# Patient Record
Sex: Female | Born: 2000 | Race: White | Hispanic: No | Marital: Single | Smoking: Never smoker
Health system: Southern US, Community
[De-identification: ages and names within clinical notes are randomized; demographics above are authoritative.]

## PROBLEM LIST (undated history)

## (undated) DIAGNOSIS — I10 Essential (primary) hypertension: Secondary | ICD-10-CM

## (undated) DIAGNOSIS — T7840XA Allergy, unspecified, initial encounter: Secondary | ICD-10-CM

## (undated) HISTORY — DX: Allergy, unspecified, initial encounter: T78.40XA

## (undated) HISTORY — PX: TONSILLECTOMY: SUR1361

## (undated) HISTORY — DX: Essential (primary) hypertension: I10

## (undated) HISTORY — PX: FRACTURE SURGERY: SHX138

---
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
## 2021-12-21 ENCOUNTER — Emergency Department
Admission: EM | Admit: 2021-12-21 | Discharge: 2021-12-21 | Disposition: A | Discharge status: Home / Self Care | Payer: BC Managed Care – PPO | Attending: Emergency Medicine | Admitting: Emergency Medicine

## 2021-12-21 ENCOUNTER — Emergency Department: Payer: BC Managed Care – PPO

## 2021-12-21 ENCOUNTER — Other Ambulatory Visit: Payer: Self-pay

## 2021-12-21 ENCOUNTER — Encounter: Payer: Self-pay | Admitting: Emergency Medicine

## 2021-12-21 DIAGNOSIS — R030 Elevated blood-pressure reading, without diagnosis of hypertension: Secondary | ICD-10-CM

## 2021-12-21 DIAGNOSIS — R519 Headache, unspecified: Secondary | ICD-10-CM | POA: Insufficient documentation

## 2021-12-21 LAB — CBC WITH DIFFERENTIAL/PLATELET
Abs Immature Granulocytes: 0.04 10*3/uL (ref 0.00–0.07)
Basophils Absolute: 0.1 10*3/uL (ref 0.0–0.1)
Basophils Relative: 1 %
Eosinophils Absolute: 0.3 10*3/uL (ref 0.0–0.5)
Eosinophils Relative: 2 %
HCT: 44.3 % (ref 36.0–46.0)
Hemoglobin: 14.7 g/dL (ref 12.0–15.0)
Immature Granulocytes: 0 %
Lymphocytes Relative: 26 %
Lymphs Abs: 3.1 10*3/uL (ref 0.7–4.0)
MCH: 28.4 pg (ref 26.0–34.0)
MCHC: 33.2 g/dL (ref 30.0–36.0)
MCV: 85.5 fL (ref 80.0–100.0)
Monocytes Absolute: 1.1 10*3/uL — ABNORMAL HIGH (ref 0.1–1.0)
Monocytes Relative: 9 %
Neutro Abs: 7.2 10*3/uL (ref 1.7–7.7)
Neutrophils Relative %: 62 %
Platelets: 247 10*3/uL (ref 150–400)
RBC: 5.18 MIL/uL — ABNORMAL HIGH (ref 3.87–5.11)
RDW: 12.5 % (ref 11.5–15.5)
WBC: 11.8 10*3/uL — ABNORMAL HIGH (ref 4.0–10.5)
nRBC: 0 % (ref 0.0–0.2)

## 2021-12-21 LAB — BASIC METABOLIC PANEL
Anion gap: 9 (ref 5–15)
BUN: 13 mg/dL (ref 6–20)
CO2: 26 mmol/L (ref 22–32)
Calcium: 9.6 mg/dL (ref 8.9–10.3)
Chloride: 102 mmol/L (ref 98–111)
Creatinine, Ser: 0.81 mg/dL (ref 0.44–1.00)
GFR, Estimated: >60 mL/min (ref >60)
Glucose, Bld: 96 mg/dL (ref 70–99)
Potassium: 3.9 mmol/L (ref 3.5–5.1)
Sodium: 137 mmol/L (ref 135–145)

## 2021-12-21 MED ORDER — SODIUM CHLORIDE 0.9 % IV BOLUS
1000.0000 mL | Freq: Once | INTRAVENOUS | Status: AC
  Administered 2021-12-21: 1000 mL via INTRAVENOUS

## 2021-12-21 MED ORDER — KETOROLAC TROMETHAMINE 30 MG/ML IJ SOLN
30.0000 mg | Freq: Once | INTRAMUSCULAR | Status: AC
  Administered 2021-12-21: 30 mg via INTRAVENOUS
  Filled 2021-12-21: qty 1

## 2021-12-21 MED ORDER — METOCLOPRAMIDE HCL 5 MG/ML IJ SOLN
10.0000 mg | Freq: Once | INTRAMUSCULAR | Status: AC
Start: 2021-12-21 — End: 2021-12-21
  Administered 2021-12-21: 10 mg via INTRAVENOUS
  Filled 2021-12-21: qty 2

## 2021-12-21 MED ORDER — HYDROCHLOROTHIAZIDE 25 MG PO TABS: 25.0000 mg | ORAL_TABLET | Freq: Every day | ORAL | 2 refills | Status: DC

## 2021-12-21 NOTE — Discharge Instructions (Signed)
Follow-up with [facility].  Please call them for appointment.  Tell them you are seen in the ER we recommended you see a GYN doctor to discuss other birth control options.  Monitor your blood pressure daily.  Cut out salt and sodium rich foods. If your blood pressure remains elevated you may start on the HCTZ daily. Do try drinking more water, use 1 teaspoon of cinnamon daily.  Return to emergency department if you are worsening.

## 2021-12-21 NOTE — ED Provider Triage Note (Signed)
Emergency Medicine Provider Triage Evaluation Note  Colleen Le , a 21 y.o. female  was evaluated in triage.  Pt complains of headache, elevated blood pressure.  Patient is on birth control pills x7 months.  Review of Systems  Positive: Headache, elevated blood pressure Negative: Chest pain shortness of breath  Physical Exam  BP (!) 153/104 (BP Location: Left Arm)    Pulse (!) 123    Temp 98.3 F (36.8 C) (Oral)    Resp 18    Ht 5\' 6"  (1.676 m)    Wt 89.8 kg    SpO2 100%    BMI 31.96 kg/m  Gen:   Awake, no distress   Resp:  Normal effort  MSK:   Moves extremities without difficulty  Other:    Medical Decision Making  Medically screening exam initiated at 2:34 PM.  Appropriate orders placed.  was informed that the remainder of the evaluation will be completed by another provider, this initial triage assessment does not replace that evaluation, and the importance of remaining in the ED until their evaluation is complete.  Mother states the child's had some numbness, will do CT due to elevated blood pressure.  Explained to the patient that possibility that her birth control pills are elevating her blood pressure.   [name], PA-C 12/21/21 1435

## 2021-12-21 NOTE — ED Notes (Signed)
Pt to ED with mother, had severe HA and went to walk in clinic and they told her BP was high (SBP 150). On waiting lists for several PCP offices.

## 2021-12-21 NOTE — ED Triage Notes (Signed)
Pt here with chronic headaches and hypertension. Pt states her bp was 148/100 earlier. Pt states that the headaches began 3 weeks ago. Pt had nausea this morning but denies V/D. Pt in NAD in triage.

## 2021-12-21 NOTE — ED Provider Notes (Signed)
[facility] Provider Note    Event Date/Time   First MD Initiated Contact with Patient 12/21/21 1533     (approximate)   History   Headache and Hypertension   HPI  Colleen Le is a 21 y.o. female presents emergency department with mother.  Patient states she has had a headache for about 3 weeks.  Now starting to get some numbness and tingling into the arms.  Is on birth control pills.  Is on the first week of her new pack.  Thinks she is on Loestrin FE.  No chest pain or shortness of breath.  No vomiting      Physical Exam   Triage Vital Signs: ED Triage Vitals  Enc Vitals Group     BP 12/21/21 1415 (!) 153/104     Pulse Rate 12/21/21 1415 (!) 123     Resp 12/21/21 1415 18     Temp 12/21/21 1415 98.3 F (36.8 C)     Temp Source 12/21/21 1415 Oral     SpO2 12/21/21 1415 100 %     Weight 12/21/21 1416 198 lb (89.8 kg)     Height 12/21/21 1416 5\' 6"  (1.676 m)     Head Circumference --      Peak Flow --      Pain Score 12/21/21 1416 6     Pain Loc --      Pain Edu? --      Excl. in GC? --     Most recent vital signs: Vitals:   12/21/21 1546 12/21/21 1630  BP: 137/87 121/83  Pulse: (!) 103 96  Resp: 16 16  Temp:    SpO2: 100% 100%     General: Awake, no distress.   CV:  Good peripheral perfusion. regular rate and  rhythm Resp:  Normal effort. Lungs c t a Abd:  No distention.   Other:  Cranial nerves II through XII grossly intact, grips equal bilaterally   ED Results / Procedures / Treatments   Labs (all labs ordered are listed, but only abnormal results are displayed) Labs Reviewed  CBC WITH DIFFERENTIAL/PLATELET - Abnormal; Notable for the following components:      Result Value   WBC 11.8 (*)    RBC 5.18 (*)    Monocytes Absolute 1.1 (*)    All other components within normal limits  BASIC METABOLIC PANEL     EKG     RADIOLOGY CT of the head    PROCEDURES:   Procedures   MEDICATIONS ORDERED IN  ED: Medications  sodium chloride 0.9 % bolus 1,000 mL (1,000 mLs Intravenous New Bag/Given 12/21/21 1630)  ketorolac (TORADOL) 30 MG/ML injection 30 mg (30 mg Intravenous Given 12/21/21 1632)  metoCLOPramide (REGLAN) injection 10 mg (10 mg Intravenous Given 12/21/21 1631)     IMPRESSION / MDM / ASSESSMENT AND PLAN / ED COURSE  I reviewed the triage vital signs and the nursing notes.                              Differential diagnosis includes, but is not limited to, CVA, SAH, hormone related headache due to birth control pills, hypertension headache, COVID  CT of the head was independently reviewed by me and do not see any acute abnormality.  CT was read as negative by the radiologist.  Since the patient continues to have headache here in the ED although her blood pressure did decrease  while in the exam room, feel that we will give her migraine cocktail and do basic labs.  Patient is feeling better after the 1 L normal saline, Toradol, Reglan.  CBC and metabolic panel are normal.  Does not show any abnormality for kidney function.  I did discuss all of this with the patient and her mother.  I do feel her elevated blood pressure may actually be coming from her birth control.  Recommend she follow-up with GYN to discuss birth control options.  Monitor for blood pressure daily.  Cut out salty foods and fast food.  Patient is in agreement treatment plan.  They are to return emergency department if worsening.  She is discharged stable condition.       FINAL CLINICAL IMPRESSION(S) / ED DIAGNOSES   Final diagnoses:  Elevated blood pressure reading  Bad headache     Rx / DC Orders   ED Discharge Orders          Ordered    hydrochlorothiazide (HYDRODIURIL) 25 MG tablet  Daily        12/21/21 1726             Note:  This document was prepared using Dragon voice recognition software and may include unintentional dictation errors.    Faythe Ghee, PA-C 12/21/21 1729     Willy Eddy, MD 12/21/21 2005

## 2021-12-24 ENCOUNTER — Other Ambulatory Visit: Payer: Self-pay | Admitting: Physician Assistant

## 2021-12-24 DIAGNOSIS — R030 Elevated blood-pressure reading, without diagnosis of hypertension: Secondary | ICD-10-CM

## 2021-12-28 ENCOUNTER — Ambulatory Visit: Payer: BC Managed Care – PPO

## 2022-01-03 DIAGNOSIS — I10 Essential (primary) hypertension: Secondary | ICD-10-CM | POA: Insufficient documentation

## 2022-03-07 ENCOUNTER — Other Ambulatory Visit (HOSPITAL_COMMUNITY)
Admission: RE | Admit: 2022-03-07 | Discharge: 2022-03-07 | Disposition: A | Discharge status: Home / Self Care | Payer: BC Managed Care – PPO | Source: Ambulatory Visit | Attending: Nurse Practitioner | Admitting: Nurse Practitioner

## 2022-03-07 ENCOUNTER — Ambulatory Visit: Payer: BC Managed Care – PPO | Admitting: Nurse Practitioner

## 2022-03-07 ENCOUNTER — Other Ambulatory Visit: Payer: Self-pay

## 2022-03-07 ENCOUNTER — Encounter: Payer: Self-pay | Admitting: Nurse Practitioner

## 2022-03-07 VITALS — BP 124/82 | HR 89 | Temp 98.4°F | Resp 18 | Ht 66.0 in | Wt 201.8 lb

## 2022-03-07 DIAGNOSIS — Z114 Encounter for screening for human immunodeficiency virus [HIV]: Secondary | ICD-10-CM

## 2022-03-07 DIAGNOSIS — Z1159 Encounter for screening for other viral diseases: Secondary | ICD-10-CM

## 2022-03-07 DIAGNOSIS — I10 Essential (primary) hypertension: Secondary | ICD-10-CM

## 2022-03-07 DIAGNOSIS — Z7689 Persons encountering health services in other specified circumstances: Secondary | ICD-10-CM

## 2022-03-07 DIAGNOSIS — Z118 Encounter for screening for other infectious and parasitic diseases: Secondary | ICD-10-CM | POA: Diagnosis present

## 2022-03-07 NOTE — Progress Notes (Signed)
? ?BP 124/82   Pulse 89   Temp 98.4 ?F (36.9 ?C) (Oral)   Resp 18   Ht 5\' 6"  (1.676 m)   Wt 201 lb 12.8 oz (91.5 kg)   LMP 02/28/2022   SpO2 100%   BMI 32.57 kg/m?   ? ?Subjective:  ? ? Patient ID: Colleen Le, female    DOB: June 11, 2001, 21 y.o.   MRN: [id number] ? ?HPI: ?KARELLY Le is a 21 y.o. female ? ?Chief Complaint  ?Patient presents with  ? Establish Care  ? 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  Last physical she says it is probably at least a year ago. Recent diagnosed with hypertension. Back condition going to request records.   ? ?Hypertension: She says that her blood pressure has been running really high.  She went to the [facility] for headaches. When she was checking in they told her she needed to go to the hospital because her blood pressure was too high. She went to the er was given a migraine cocktail to help with her blood pressure.  She says they told her to stop the birth control.  She then went and saw her pediatrician who started her on hydrochlorothiazide 25 mg daily.  Then went to cardiologist who decreased her hydrochlorothiazide to 12.5 mg daily.  She then returned six weeks later for follow up and her blood pressure was still elevated so her dose was increased back to 25 mg daily.  She says her blood pressure has been running 141/100-133/88.  Her blood pressure is 124/82.  Continue current treatment plan continue to monitor blood pressure and keep follow-up appointment with cardiology which is at the end of May.  Reduce sodium intake, make sure you are drinking plenty of water. ? ?Relevant past medical, surgical, family and social history reviewed and updated as indicated. Interim medical history since our last visit reviewed. ?Allergies and medications reviewed and updated. ? ?Review of Systems ? ?Constitutional: Negative for fever or weight change.  ?Respiratory: Negative for cough and shortness of breath.    ?Cardiovascular: Negative for chest pain or palpitations.  ?Gastrointestinal: Negative for abdominal pain, no bowel changes.  ?Musculoskeletal: Negative for gait problem or joint swelling.  ?Skin: Negative for rash.  ?Neurological: Negative for dizziness or headache.  ?No other specific complaints in a complete review of systems (except as listed in HPI above).  ? ?   ?Objective:  ?  ?BP 124/82   Pulse 89   Temp 98.4 ?F (36.9 ?C) (Oral)   Resp 18   Ht 5\' 6"  (1.676 m)   Wt 201 lb 12.8 oz (91.5 kg)   LMP 02/28/2022   SpO2 100%   BMI 32.57 kg/m?   ?Wt Readings from Last 3 Encounters:  ?03/07/22 201 lb 12.8 oz (91.5 kg) (98 %, Z= 1.97)*  ?12/21/21 198 lb (89.8 kg) (97 %, Z= 1.92)*  ?07/03/20 192 lb 6.4 oz (87.3 kg) (97 %, Z= 1.89)*  ? ?* Growth percentiles are based on CDC (Girls, 2-20 Years) data.  ?  ?Physical Exam ? ?Constitutional: Patient appears well-developed and well-nourished. Obese  No distress.  ?HEENT: head atraumatic, normocephalic, pupils equal and reactive to light,  neck supple, throat within normal limits ?Cardiovascular: Normal rate, regular rhythm and normal heart sounds.  No murmur heard. No BLE edema. ?Pulmonary/Chest: Effort normal and breath sounds normal. No respiratory distress. ?Abdominal: Soft.  There is no tenderness. ?Psychiatric: Patient has a normal  mood and affect. behavior is normal. Judgment and thought content normal.  ?Results for orders placed or performed during the hospital encounter of 12/21/21  ?Basic metabolic panel  ?Result Value Ref Range  ? Sodium 137 135 - 145 mmol/L  ? Potassium 3.9 3.5 - 5.1 mmol/L  ? Chloride 102 98 - 111 mmol/L  ? CO2 26 22 - 32 mmol/L  ? Glucose, Bld 96 70 - 99 mg/dL  ? BUN 13 6 - 20 mg/dL  ? Creatinine, Ser 0.81 0.44 - 1.00 mg/dL  ? Calcium 9.6 8.9 - 10.3 mg/dL  ? GFR, Estimated >60 >60 mL/min  ? Anion gap 9 5 - 15  ?CBC with Differential  ?Result Value Ref Range  ? WBC 11.8 (H) 4.0 - 10.5 K/uL  ? RBC 5.18 (H) 3.87 - 5.11 MIL/uL  ? Hemoglobin  14.7 12.0 - 15.0 g/dL  ? HCT 44.3 36.0 - 46.0 %  ? MCV 85.5 80.0 - 100.0 fL  ? MCH 28.4 26.0 - 34.0 pg  ? MCHC 33.2 30.0 - 36.0 g/dL  ? RDW 12.5 11.5 - 15.5 %  ? Platelets 247 150 - 400 K/uL  ? nRBC 0.0 0.0 - 0.2 %  ? Neutrophils Relative % 62 %  ? Neutro Abs 7.2 1.7 - 7.7 K/uL  ? Lymphocytes Relative 26 %  ? Lymphs Abs 3.1 0.7 - 4.0 K/uL  ? Monocytes Relative 9 %  ? Monocytes Absolute 1.1 (H) 0.1 - 1.0 K/uL  ? Eosinophils Relative 2 %  ? Eosinophils Absolute 0.3 0.0 - 0.5 K/uL  ? Basophils Relative 1 %  ? Basophils Absolute 0.1 0.0 - 0.1 K/uL  ? Immature Granulocytes 0 %  ? Abs Immature Granulocytes 0.04 0.00 - 0.07 K/uL  ? ?   ?Assessment & Plan:  ? ?Problem List Items Addressed This Visit   ? ?  ? Cardiovascular and Mediastinum  ? Essential hypertension - Primary  ?  Patient blood pressure today is 124/82.  Patient reports that her blood pressure is still running high at home.  She says that she does take her hydrochlorothiazide 25 mg daily.  She does have a follow-up with cardiology at the end of the month.  Discussed decreasing sodium intake and drinking plenty of water. ? ?  ?  ? ?Other Visit Diagnoses   ? ? Encounter for hepatitis C screening test for low risk patient      ? Relevant Orders  ? Hepatitis C antibody  ? Encounter for screening for HIV      ? Relevant Orders  ? HIV Antibody (routine testing w rflx)  ? Screening for chlamydial disease      ? Relevant Orders  ? Cervicovaginal ancillary only  ? Encounter to establish care      ? Request records from pediatrician ?Schedule CPE  ? ?  ?  ? ?Follow up plan: ?Return in about 6 months (around 09/07/2022) for cpe. ? ? ? ? ? ?

## 2022-03-07 NOTE — Assessment & Plan Note (Signed)
Patient blood pressure today is 124/82.  Patient reports that her blood pressure is still running high at home.  She says that she does take her hydrochlorothiazide 25 mg daily.  She does have a follow-up with cardiology at the end of the month.  Discussed decreasing sodium intake and drinking plenty of water. ?

## 2022-03-08 LAB — HEPATITIS C ANTIBODY
Hepatitis C Ab: NONREACTIVE
SIGNAL TO CUT-OFF: 0.09 (ref <1.00)

## 2022-03-08 LAB — HIV ANTIBODY (ROUTINE TESTING W REFLEX): HIV 1&2 Ab, 4th Generation: NONREACTIVE

## 2022-03-09 LAB — CERVICOVAGINAL ANCILLARY ONLY
Bacterial Vaginitis (gardnerella): NEGATIVE
Candida Glabrata: NEGATIVE
Candida Vaginitis: NEGATIVE
Chlamydia: NEGATIVE
Comment: NEGATIVE
Comment: NEGATIVE
Comment: NEGATIVE
Comment: NEGATIVE
Comment: NEGATIVE
Comment: NORMAL
Neisseria Gonorrhea: NEGATIVE
Trichomonas: NEGATIVE

## 2022-03-30 DIAGNOSIS — E6609 Other obesity due to excess calories: Secondary | ICD-10-CM | POA: Insufficient documentation

## 2022-04-04 ENCOUNTER — Ambulatory Visit: Payer: Self-pay | Admitting: Internal Medicine

## 2022-06-08 ENCOUNTER — Telehealth: Payer: Self-pay

## 2022-06-08 NOTE — Telephone Encounter (Signed)
Called pt to ask if she needs to get her physical done before 08/2022. Since her previous visit with Raynelle Fanning stated she should come back around November 2023 to do her physical, just curiosity.

## 2022-06-08 NOTE — Telephone Encounter (Signed)
Patient returning call  Advised patient of note below  Patient stated she does need to keep cpe date of 8-10 due to needing cpe for a new job

## 2022-06-08 NOTE — Patient Instructions (Signed)
Preventive Care 50-20 Years Old, Female Preventive care refers to lifestyle choices and visits with your health care provider that can promote health and wellness. At this stage in your life, you may start seeing a primary care physician instead of a pediatrician for your preventive care. Preventive care visits are also called wellness exams. What can I expect for my preventive care visit? Counseling During your preventive care visit, your health care provider may ask about your: Medical history, including: Past medical problems. Family medical history. Pregnancy history. Current health, including: Menstrual cycle. Method of birth control. Emotional well-being. Home life and relationship well-being. Sexual activity and sexual health. Lifestyle, including: Alcohol, nicotine or tobacco, and drug use. Access to firearms. Diet, exercise, and sleep habits. Sunscreen use. Motor vehicle safety. Physical exam Your health care provider may check your: Height and weight. These may be used to calculate your BMI (body mass index). BMI is a measurement that tells if you are at a healthy weight. Waist circumference. This measures the distance around your waistline. This measurement also tells if you are at a healthy weight and may help predict your risk of certain diseases, such as type 2 diabetes and high blood pressure. Heart rate and blood pressure. Body temperature. Skin for abnormal spots. Breasts. What immunizations do I need?  Vaccines are usually given at various ages, according to a schedule. Your health care provider will recommend vaccines for you based on your age, medical history, and lifestyle or other factors, such as travel or where you work. What tests do I need? Screening Your health care provider may recommend screening tests for certain conditions. This may include: Vision and hearing tests. Lipid and cholesterol levels. Pelvic exam and Pap test. Hepatitis B  test. Hepatitis C test. HIV (human immunodeficiency virus) test. STI (sexually transmitted infection) testing, if you are at risk. Tuberculosis skin test if you have symptoms. BRCA-related cancer screening. This may be done if you have a family history of breast, ovarian, tubal, or peritoneal cancers. Talk with your health care provider about your test results, treatment options, and if necessary, the need for more tests. Follow these instructions at home: Eating and drinking  Eat a healthy diet that includes fresh fruits and vegetables, whole grains, lean protein, and low-fat dairy products. Drink enough fluid to keep your urine pale yellow. Do not drink alcohol if: Your health care provider tells you not to drink. You are pregnant, may be pregnant, or are planning to become pregnant. You are under the legal drinking age. In the [place] the legal drinking age is 67. If you drink alcohol: Limit how much you have to 0-1 drink a day. Know how much alcohol is in your drink. In the [place] one drink equals one 12 oz bottle of beer (355 mL), one 5 oz glass of wine (148 mL), or one 1 oz glass of hard liquor (44 mL). Lifestyle Brush your teeth every morning and night with fluoride toothpaste. Floss one time each day. Exercise for at least 30 minutes 5 or more days of the week. Do not use any products that contain nicotine or tobacco. These products include cigarettes, chewing tobacco, and vaping devices, such as e-cigarettes. If you need help quitting, ask your health care provider. Do not use drugs. If you are sexually active, practice safe sex. Use a condom or other form of protection to prevent STIs. If you do not wish to become pregnant, use a form of birth control. If you plan to become pregnant,  see your health care provider for a prepregnancy visit. Find healthy ways to manage stress, such as: Meditation, yoga, or listening to music. Journaling. Talking to a trusted person. Spending time  with friends and family. Safety Always wear your seat belt while driving or riding in a vehicle. Do not drive: If you have been drinking alcohol. Do not ride with someone who has been drinking. When you are tired or distracted. While texting. If you have been using any mind-altering substances or drugs. Wear a helmet and other protective equipment during sports activities. If you have firearms in your house, make sure you follow all gun safety procedures. Seek help if you have been bullied, physically abused, or sexually abused. Use the internet responsibly to avoid dangers, such as online bullying and online sex predators. What's next? Go to your health care provider once a year for an annual wellness visit. Ask your health care provider how often you should have your eyes and teeth checked. Stay up to date on all vaccines. This information is not intended to replace advice given to you by your health care provider. Make sure you discuss any questions you have with your health care provider. Document Revised: 04/14/2021 Document Reviewed: 04/14/2021 Elsevier Patient Education  2023 [employer]  

## 2022-06-09 ENCOUNTER — Encounter: Payer: Self-pay | Admitting: Family Medicine

## 2022-06-09 ENCOUNTER — Ambulatory Visit (INDEPENDENT_AMBULATORY_CARE_PROVIDER_SITE_OTHER): Payer: BC Managed Care – PPO | Admitting: Family Medicine

## 2022-06-09 VITALS — BP 120/74 | HR 81 | Temp 98.2°F | Resp 16 | Ht 66.0 in | Wt 206.3 lb

## 2022-06-09 DIAGNOSIS — Z6833 Body mass index (BMI) 33.0-33.9, adult: Secondary | ICD-10-CM | POA: Diagnosis not present

## 2022-06-09 DIAGNOSIS — M4306 Spondylolysis, lumbar region: Secondary | ICD-10-CM

## 2022-06-09 DIAGNOSIS — Z30013 Encounter for initial prescription of injectable contraceptive: Secondary | ICD-10-CM | POA: Diagnosis not present

## 2022-06-09 DIAGNOSIS — Z111 Encounter for screening for respiratory tuberculosis: Secondary | ICD-10-CM

## 2022-06-09 DIAGNOSIS — E6609 Other obesity due to excess calories: Secondary | ICD-10-CM | POA: Diagnosis not present

## 2022-06-09 DIAGNOSIS — Z0289 Encounter for other administrative examinations: Secondary | ICD-10-CM | POA: Diagnosis not present

## 2022-06-09 DIAGNOSIS — I10 Essential (primary) hypertension: Secondary | ICD-10-CM | POA: Diagnosis not present

## 2022-06-09 DIAGNOSIS — Z0000 Encounter for general adult medical examination without abnormal findings: Secondary | ICD-10-CM

## 2022-06-09 MED ORDER — MEDROXYPROGESTERONE ACETATE 150 MG/ML IM SUSP: 150.0000 mg | INTRAMUSCULAR | 3 refills | Status: DC

## 2022-06-09 NOTE — Progress Notes (Signed)
Patient: Colleen Le, Female    DOB: 08-31-2001, 21 y.o.   MRN: [id number] Bo Merino, FNP Visit Date: 06/09/2022  Today's Provider: Delsa Grana, PA-C   Chief Complaint  Patient presents with   Annual Exam   Subjective:   Annual physical exam:  Colleen Le is a 21 y.o. female who presents today for complete physical exam:  Exercise/Activity:  exercises about 3x a week for 30 min, has limitations due to her back Diet/nutrition:  tries to be healthy Sleep:  no concerns noted  SDOH Screenings   Alcohol Screen: Low Risk  (06/09/2022)   Alcohol Screen    Last Alcohol Screening Score (AUDIT): 0  Depression (PHQ2-9): Low Risk  (06/09/2022)   Depression (PHQ2-9)    PHQ-2 Score: 0  Financial Resource Strain: Low Risk  (06/09/2022)   Overall Financial Resource Strain (CARDIA)    Difficulty of Paying Living Expenses: Not hard at all  Food Insecurity: No Food Insecurity (06/09/2022)   Hunger Vital Sign    Worried About Running Out of Food in the Last Year: Never true    [place] in the Last Year: Never true  Housing: Low Risk  (06/09/2022)   Housing    Last Housing Risk Score: 0  Physical Activity: Insufficiently Active (06/09/2022)   Exercise Vital Sign    Days of Exercise per Week: 3 days    Minutes of Exercise per Session: 30 min  Social Connections: Socially Isolated (06/09/2022)   Social Connection and Isolation Panel [NHANES]    Frequency of Communication with Friends and Family: More than three times a week    Frequency of Social Gatherings with Friends and Family: More than three times a week    Attends Religious Services: Never    Marine scientist or Organizations: No    Attends Archivist Meetings: Never    Marital Status: Never married  Stress: No Stress Concern Present (06/09/2022)   [employer] [place] of Stress : Only a little  Tobacco Use: Low Risk   (06/09/2022)   Patient History    Smoking Tobacco Use: Never    Smokeless Tobacco Use: Never    Passive Exposure: Not on file  Transportation Needs: No Transportation Needs (06/09/2022)   PRAPARE - Transportation    Lack of Transportation (Medical): No    Lack of Transportation (Non-Medical): No     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  [place] birth control due to high BP, is now on HCTZ BP well controlled Sexually active with her significant other, one female partner for 3+ years - no plan to start a family any time soon OCP gave HA, felt moody, and BP high Using condoms Regular cycles  USPSTF grade A and B recommendations - reviewed and addressed today  Depression:  Phq 9 completed today by patient, was reviewed by me with patient in the room PHQ score is neg, pt feels good    06/09/2022    3:03 PM 03/07/2022    2:35 PM  PHQ 2/9 Scores  PHQ - 2 Score 0 0  PHQ- 9 Score 0       06/09/2022    3:03 PM 03/07/2022    2:35 PM  Depression screen PHQ 2/9  Decreased Interest 0 0  Down, Depressed, Hopeless 0 0  PHQ - 2 Score 0 0  Altered sleeping 0   Tired, decreased energy  0   Change in appetite 0   Feeling bad or failure about yourself  0   Trouble concentrating 0   Moving slowly or fidgety/restless 0   Suicidal thoughts 0   PHQ-9 Score 0   Difficult doing work/chores Not difficult at all     Alcohol screening: [place] Visit from 06/09/2022 in [facility]  AUDIT-C Score 0       Immunizations and Health Maintenance: Health Maintenance  Topic Date Due   COVID-19 Vaccine (1) Never done   INFLUENZA VACCINE  05/31/2022   CHLAMYDIA SCREENING  03/08/2023   TETANUS/TDAP  09/14/2023   HPV VACCINES  Completed   Hepatitis C Screening  Completed   HIV Screening  Completed     Hep C Screening: done  STD testing and prevention (HIV/chl/gon/syphilis):  see above, no additional testing desired by pt today - low risk with one partner,  recent testing reviewed and neg  Intimate partner violence:denies  Sexual History/Pain during Intercourse:sexually active with BF, no concerns  Menstrual History/LMP/Abnormal Bleeding: regular cycles Patient's last menstrual period was 06/01/2022 (approximate).  Incontinence Symptoms: none  Breast cancer: n/a per age Last Mammogram: *see HM list above BRCA gene screening:   Cervical cancer screening: not due until 21 y/o Pt denies family hx of cancers - breast, ovarian, uterine, colon:     Osteoporosis:  n/a Discussion on osteoporosis per age, including high calcium and vitamin D supplementation, weight bearing exercises   Skin cancer:  Hx of skin CA -  NO Discussed atypical lesions   Colorectal cancer:   Colonoscopy is not due per age   Discussed concerning signs and sx of CRC, pt denies change in bowels, melena  Lung cancer:   Low Dose CT Chest recommended if Age 20-20 years, 20 pack-year currently smoking OR have quit w/in 15years. Patient does not qualify.    Social History   Tobacco Use   Smoking status: Never   Smokeless tobacco: Never  Vaping Use   Vaping Use: Never used  Substance Use Topics   Alcohol use: Yes   Drug use: Never     [place] Office Visit from 06/09/2022 in [facility]  AUDIT-C Score 0       Family History  Problem Relation Age of Onset   Diabetes Mother    Hypertension Mother    Healthy Mother    Hypertension Father      Blood pressure/Hypertension: BP Readings from Last 3 Encounters:  06/09/22 120/74  03/07/22 124/82  12/21/21 121/83    Weight/Obesity: Wt Readings from Last 3 Encounters:  06/09/22 206 lb 4.8 oz (93.6 kg) (98 %, Z= 2.03)*  03/07/22 201 lb 12.8 oz (91.5 kg) (98 %, Z= 1.97)*  12/21/21 198 lb (89.8 kg) (97 %, Z= 1.92)*   * Growth percentiles are based on CDC (Girls, 2-20 Years) data.   BMI Readings from Last 3 Encounters:  06/09/22 33.30 kg/m (96 %, Z= 1.74)*  03/07/22 32.57 kg/m  (96 %, Z= 1.71)*  12/21/21 31.96 kg/m (95 %, Z= 1.69)*   * Growth percentiles are based on CDC (Girls, 2-20 Years) data.     Lipids:  No results found for: "CHOL" No results found for: "HDL" No results found for: "LDLCALC" No results found for: "TRIG" No results found for: "CHOLHDL" No results found for: "LDLDIRECT" Based on the results of lipid panel his/her cardiovascular risk factor ( using [place] )  in the next 10 years is:  The ASCVD Risk score (Arnett DK, et al., 2019) failed to calculate for the following reasons:   The 2019 ASCVD risk score is only valid for ages 19 to 33  Glucose:  Glucose, Bld  Date Value Ref Range Status  12/21/2021 96 70 - 99 mg/dL Final    Comment:    Glucose reference range applies only to samples taken after fasting for at least 8 hours.    Advanced Care Planning:  A voluntary discussion about advance care planning including the explanation and discussion of advance directives.     Social History       Social History   Socioeconomic History   Marital status: Single    Spouse name: Not on file   Number of children: Not on file   Years of education: Not on file   Highest education level: Not on file  Occupational History   Not on file  Tobacco Use   Smoking status: Never   Smokeless tobacco: Never  Vaping Use   Vaping Use: Never used  Substance and Sexual Activity   Alcohol use: Yes   Drug use: Never   Sexual activity: Yes  Other Topics Concern   Not on file  Social History Narrative   Not on file   Social Determinants of Health   Financial Resource Strain: Low Risk  (06/09/2022)   Overall Financial Resource Strain (CARDIA)    Difficulty of Paying Living Expenses: Not hard at all  Food Insecurity: No Food Insecurity (06/09/2022)   Hunger Vital Sign    Worried About Running Out of Food in the Last Year: Never true    [place] in the Last Year: Never true  Transportation Needs: No Transportation Needs (06/09/2022)    PRAPARE - Hydrologist (Medical): No    Lack of Transportation (Non-Medical): No  Physical Activity: Insufficiently Active (06/09/2022)   Exercise Vital Sign    Days of Exercise per Week: 3 days    Minutes of Exercise per Session: 30 min  Stress: No Stress Concern Present (06/09/2022)   [place]    Feeling of Stress : Only a little  Social Connections: Socially Isolated (06/09/2022)   Social Connection and Isolation Panel [NHANES]    Frequency of Communication with Friends and Family: More than three times a week    Frequency of Social Gatherings with Friends and Family: More than three times a week    Attends Religious Services: Never    Marine scientist or Organizations: No    Attends Music therapist: Never    Marital Status: Never married    Family History        Family History  Problem Relation Age of Onset   Diabetes Mother    Hypertension Mother    Healthy Mother    Hypertension Father     Patient Active Problem List   Diagnosis Date Noted   Essential hypertension 01/03/2022    Past Surgical History:  Procedure Laterality Date   FRACTURE SURGERY     TONSILLECTOMY       Current Outpatient Medications:    hydrochlorothiazide (HYDRODIURIL) 25 MG tablet, Take 1 tablet (25 mg total) by mouth daily., Disp: 30 tablet, Rfl: 2  No Known Allergies  Patient Care Team: Bo Merino, FNP as PCP - General (Nurse Practitioner)   Chart Review: I personally reviewed active problem list, medication list, allergies, family history, social  history, health maintenance, notes from last encounter, lab results, imaging with the patient/caregiver today.   Review of Systems  Constitutional: Negative.   HENT: Negative.    Eyes: Negative.   Respiratory: Negative.    Cardiovascular: Negative.   Gastrointestinal: Negative.   Endocrine: Negative.    Genitourinary: Negative.   Musculoskeletal: Negative.   Skin: Negative.   Allergic/Immunologic: Negative.   Neurological: Negative.   Hematological: Negative.   Psychiatric/Behavioral: Negative.    All other systems reviewed and are negative.         Objective:   Vitals:  Vitals:   06/09/22 1505  BP: 120/74  Pulse: 81  Resp: 16  Temp: 98.2 F (36.8 C)  TempSrc: Oral  SpO2: 98%  Weight: 206 lb 4.8 oz (93.6 kg)  Height: _0  (1.676 m)    Body mass index is 33.3 kg/m.  Physical Exam Vitals and nursing note reviewed.  Constitutional:      General: She is not in acute distress.    Appearance: Normal appearance. She is well-developed, well-groomed and overweight. She is not ill-appearing, toxic-appearing or diaphoretic.  HENT:     Head: Normocephalic and atraumatic.     Right Ear: External ear normal.     Left Ear: External ear normal.     Nose: Nose normal.     Mouth/Throat:     Mouth: Mucous membranes are moist.     Pharynx: Oropharynx is clear.  Eyes:     General: No scleral icterus.       Right eye: No discharge.        Left eye: No discharge.     Conjunctiva/sclera: Conjunctivae normal.  Neck:     Trachea: No tracheal deviation.  Cardiovascular:     Rate and Rhythm: Normal rate and regular rhythm.     Pulses: Normal pulses.     Heart sounds: Normal heart sounds. No murmur heard.    No friction rub. No gallop.  Pulmonary:     Effort: Pulmonary effort is normal. No respiratory distress.     Breath sounds: No stridor.  Abdominal:     General: Bowel sounds are normal.     Palpations: Abdomen is soft.  Musculoskeletal:     Right lower leg: No edema.     Left lower leg: No edema.  Skin:    General: Skin is warm and dry.     Findings: No rash.  Neurological:     Mental Status: She is alert. Mental status is at baseline.     Motor: No abnormal muscle tone.     Coordination: Coordination normal.     Gait: Gait normal.  Psychiatric:        Mood and  Affect: Mood normal.        Behavior: Behavior normal. Behavior is cooperative.       Fall Risk:    06/09/2022    3:03 PM 03/07/2022    2:35 PM  [place] in the past year? 0 0  Number falls in past yr: 0 0  Injury with Fall? 0 0  Risk for fall due to : No Fall Risks   Follow up Falls prevention discussed;Education provided Falls evaluation completed    Functional Status Survey: Is the patient deaf or have difficulty hearing?: No Does the patient have difficulty seeing, even when wearing glasses/contacts?: Yes Does the patient have difficulty concentrating, remembering, or making decisions?: No Does the patient have difficulty walking or climbing stairs?: No Does  the patient have difficulty dressing or bathing?: No Does the patient have difficulty doing errands alone such as visiting a doctor's office or shopping?: No   Assessment & Plan:    CPE completed today  USPSTF grade A and B recommendations reviewed with patient; age-appropriate recommendations, preventive care, screening tests, etc discussed and encouraged; healthy living encouraged; see AVS for patient education given to patient  Discussed importance of 150 minutes of physical activity weekly, AHA exercise recommendations given to pt in AVS/handout  Discussed importance of healthy diet:  eating lean meats and proteins, avoiding trans fats and saturated fats, avoid simple sugars and excessive carbs in diet, eat 6 servings of fruit/vegetables daily and drink plenty of water and avoid sweet beverages.    Recommended pt to do annual eye exam and routine dental exams/cleanings  Depression, alcohol, fall screening completed as documented above and per flowsheets  Advance Care planning information and packet discussed and offered today, encouraged pt to discuss with family members/spouse/partner/friends and complete Advanced directive packet and bring copy to office   Reviewed Health Maintenance: Health  Maintenance  Topic Date Due   COVID-19 Vaccine (1) Never done   INFLUENZA VACCINE  05/31/2022   CHLAMYDIA SCREENING  03/08/2023   TETANUS/TDAP  09/14/2023   HPV VACCINES  Completed   Hepatitis C Screening  Completed   HIV Screening  Completed    Immunizations: Immunization History  Administered Date(s) Administered   HPV 9-valent 07/14/2015, 12/12/2016   Tdap 09/13/2013   Vaccines:   vaccinations from [facility] reviewed today with pt, due for Tdap in 2024 otherwise all utd HPV: completed Shingrix: n/a Pneumonia: n/a Flu: recommended she get in the next few months -  educated and discussed with patient.   Problem List Items Addressed This Visit       Cardiovascular and Mediastinum   Essential hypertension    Stable and well controlled with HCTZ 25 mg daily, continue diet/lifestyle efforts Overall much better off OCP She has follow up with cardiology, labs and OV reviewed        Musculoskeletal and Integument   Pars defect of lumbar spine     Other   Class 1 obesity due to excess calories without serious comorbidity with body mass index (BMI) of 33.0 to 33.9 in adult   Other Visit Diagnoses     Annual physical exam    -  Primary   Relevant Medications   medroxyPROGESTERone (DEPO-PROVERA) 150 MG/ML injection   Screening-pulmonary TB       asx, no risk with screening questions, new job requires PPD or TB screening    Relevant Orders   QuantiFERON-TB Gold Plus   Encounter for initial prescription of injectable contraceptive       discussed options to avoid pregnancy, may need to avoid estrogens, she would like to try depo shot, continue condoms x 2 weeks after starting   Relevant Medications   medroxyPROGESTERone (DEPO-PROVERA) 150 MG/ML injection   Encounter for completion of form with patient       employment forms completed today - pending labs, will scan to chart when completed             Delsa Grana, PA-C 06/09/22 3:34 PM  [place]

## 2022-06-09 NOTE — Assessment & Plan Note (Signed)
Stable and well controlled with HCTZ 25 mg daily, continue diet/lifestyle efforts Overall much better off OCP She has follow up with cardiology, labs and OV reviewed

## 2022-06-13 ENCOUNTER — Ambulatory Visit (INDEPENDENT_AMBULATORY_CARE_PROVIDER_SITE_OTHER): Payer: BC Managed Care – PPO

## 2022-06-13 DIAGNOSIS — Z30013 Encounter for initial prescription of injectable contraceptive: Secondary | ICD-10-CM | POA: Diagnosis not present

## 2022-06-13 DIAGNOSIS — Z3049 Encounter for surveillance of other contraceptives: Secondary | ICD-10-CM

## 2022-06-13 LAB — QUANTIFERON-TB GOLD PLUS
Mitogen-NIL: >10 IU/mL
NIL: 0.06 IU/mL
QuantiFERON-TB Gold Plus: NEGATIVE
TB1-NIL: <0 IU/mL
TB2-NIL: <0 IU/mL

## 2022-06-13 MED ORDER — MEDROXYPROGESTERONE ACETATE 150 MG/ML IM SUSP
150.0000 mg | Freq: Once | INTRAMUSCULAR | Status: AC
  Administered 2022-06-13: 150 mg via INTRAMUSCULAR

## 2022-08-12 ENCOUNTER — Encounter: Payer: Self-pay | Admitting: Nurse Practitioner

## 2022-09-09 ENCOUNTER — Other Ambulatory Visit: Payer: Self-pay

## 2022-09-09 ENCOUNTER — Encounter: Payer: Self-pay | Admitting: Nurse Practitioner

## 2022-09-09 ENCOUNTER — Ambulatory Visit: Payer: BC Managed Care – PPO | Admitting: Nurse Practitioner

## 2022-09-09 VITALS — BP 134/82 | HR 100 | Temp 98.3°F | Resp 18 | Ht 66.0 in | Wt 213.9 lb

## 2022-09-09 DIAGNOSIS — Z3009 Encounter for other general counseling and advice on contraception: Secondary | ICD-10-CM | POA: Diagnosis not present

## 2022-09-09 DIAGNOSIS — Z131 Encounter for screening for diabetes mellitus: Secondary | ICD-10-CM

## 2022-09-09 DIAGNOSIS — I10 Essential (primary) hypertension: Secondary | ICD-10-CM

## 2022-09-09 DIAGNOSIS — G43709 Chronic migraine without aura, not intractable, without status migrainosus: Secondary | ICD-10-CM | POA: Diagnosis not present

## 2022-09-09 DIAGNOSIS — R000 Tachycardia, unspecified: Secondary | ICD-10-CM | POA: Diagnosis not present

## 2022-09-09 DIAGNOSIS — Z13220 Encounter for screening for lipoid disorders: Secondary | ICD-10-CM

## 2022-09-09 MED ORDER — HYDROCHLOROTHIAZIDE 25 MG PO TABS: 25.0000 mg | ORAL_TABLET | Freq: Every day | ORAL | 2 refills | Status: DC

## 2022-09-09 MED ORDER — SUMATRIPTAN SUCCINATE 25 MG PO TABS: 25.0000 mg | ORAL_TABLET | ORAL | 0 refills | Status: DC | PRN

## 2022-09-09 MED ORDER — PROPRANOLOL HCL 40 MG PO TABS: 40.0000 mg | ORAL_TABLET | Freq: Two times a day (BID) | ORAL | 2 refills | Status: DC

## 2022-09-09 MED ORDER — MEDROXYPROGESTERONE ACETATE 150 MG/ML IM SUSP
150.0000 mg | Freq: Once | INTRAMUSCULAR | Status: AC
  Administered 2022-09-09: 150 mg via INTRAMUSCULAR

## 2022-09-09 NOTE — Assessment & Plan Note (Signed)
start propanolol 40 mg BID>

## 2022-09-09 NOTE — Assessment & Plan Note (Signed)
start propanolol 40 mg BID for preventative, imitrex for abortive therapy, if no improvement will trial nurtec.

## 2022-09-09 NOTE — Progress Notes (Signed)
BP 134/82   Pulse 100   Temp 98.3 F (36.8 C) (Oral)   Resp 18   Ht 5\' 6"  (1.676 m)   Wt 213 lb 14.4 oz (97 kg)   SpO2 99%   BMI 34.52 kg/m    Subjective:    Patient ID: , female    DOB: 09/13/2001, 21 y.o.   MRN: [id number]  HPI: Colleen Le is a 21 y.o. 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  Last physical she says it is probably at least a year ago. Recent diagnosed with hypertension  Hypertension/tachycardia:Patient has been taking hydrochlorothiazide 25 mg daily. Her blood pressure today is 134/82.  She reports her blood pressure has been running 140s/90s. Patient also reports her heart rate has been elevated around 115. Will start patient on propanolol for hear rate, blood pressure and migraine prophylaxis.   Migraine; Patient reports she has had about 15 migraines this past month.  She says her migraine is typically close to the front of her head. She says that it causes nausea.  She says she has tried migraine Excedrin with no improvement.  She says that the last any where from 1 hour to 5 hours.  She says that she does have a nagging headache on most mornings.  Will stat propanolol for preventative and imitrex for abortive therapy.   Relevant past medical, surgical, family and social history reviewed and updated as indicated. Interim medical history since our last visit reviewed. Allergies and medications reviewed and updated.  Review of Systems  Constitutional: Negative for fever or weight change.  Respiratory: Negative for cough and shortness of breath.   Cardiovascular: Negative for chest pain or palpitations.  Gastrointestinal: Negative for abdominal pain, no bowel changes.  Musculoskeletal: Negative for gait problem or joint swelling.  Skin: Negative for rash.  Neurological: Negative for dizziness, positive for  headache.  No other  specific complaints in a complete review of systems (except as listed in HPI above).      Objective:    BP 134/82   Pulse 100   Temp 98.3 F (36.8 C) (Oral)   Resp 18   Ht 5\' 6"  (1.676 m)   Wt 213 lb 14.4 oz (97 kg)   SpO2 99%   BMI 34.52 kg/m   Wt Readings from Last 3 Encounters:  09/09/22 213 lb 14.4 oz (97 kg) (98 %, Z= 2.13)*  06/09/22 206 lb 4.8 oz (93.6 kg) (98 %, Z= 2.03)*  03/07/22 201 lb 12.8 oz (91.5 kg) (98 %, Z= 1.97)*   * Growth percentiles are based on CDC (Girls, 2-20 Years) data.    Physical Exam  Constitutional: Patient appears well-developed and well-nourished. Obese  No distress.  HEENT: head atraumatic, normocephalic, pupils equal and reactive to light,  neck supple, throat within normal limits Cardiovascular: tachycardic rate, regular rhythm and normal heart sounds.  No murmur heard. No BLE edema. Pulmonary/Chest: Effort normal and breath sounds normal. No respiratory distress. Abdominal: Soft.  There is no tenderness. Psychiatric: Patient has a normal mood and affect. behavior is normal. Judgment and thought content normal.   Assessment & Plan:   Problem List Items Addressed This Visit       Cardiovascular and Mediastinum   Essential hypertension - Primary    Continue taking hydrochlorothiazide 25 mg daily, start propanolol 40 mg BID>  Relevant Medications   propranolol (INDERAL) 40 MG tablet   hydrochlorothiazide (HYDRODIURIL) 25 MG tablet   Other Relevant Orders   CBC with Differential/Platelet   COMPLETE METABOLIC PANEL WITH GFR   Chronic migraine without aura without status migrainosus, not intractable     start propanolol 40 mg BID for preventative, imitrex for abortive therapy, if no improvement will trial nurtec.       Relevant Medications   propranolol (INDERAL) 40 MG tablet   SUMAtriptan (IMITREX) 25 MG tablet   hydrochlorothiazide (HYDRODIURIL) 25 MG tablet     Other   Tachycardia     start propanolol 40 mg BID>         Other Visit Diagnoses     Screening for diabetes mellitus       Relevant Orders   Hemoglobin A1c   Screening for cholesterol level       Relevant Orders   Lipid panel   Family planning       Relevant Medications   medroxyPROGESTERone (DEPO-PROVERA) injection 150 mg (Completed)        Follow up plan: Return in about 3 months (around 12/10/2022) for follow up.

## 2022-09-09 NOTE — Assessment & Plan Note (Signed)
Continue taking hydrochlorothiazide 25 mg daily, start propanolol 40 mg BID>

## 2022-09-10 LAB — COMPLETE METABOLIC PANEL WITH GFR
AG Ratio: 1.6 (calc) (ref 1.0–2.5)
ALT: 34 U/L — ABNORMAL HIGH (ref 5–32)
AST: 18 U/L (ref 12–32)
Albumin: 4.2 g/dL (ref 3.6–5.1)
Alkaline phosphatase (APISO): 68 U/L (ref 36–128)
BUN: 14 mg/dL (ref 7–20)
CO2: 25 mmol/L (ref 20–32)
Calcium: 9.8 mg/dL (ref 8.9–10.4)
Chloride: 105 mmol/L (ref 98–110)
Creat: 0.7 mg/dL (ref 0.50–0.96)
Globulin: 2.6 g/dL (calc) (ref 2.0–3.8)
Glucose, Bld: 98 mg/dL (ref 65–99)
Potassium: 3.8 mmol/L (ref 3.8–5.1)
Sodium: 138 mmol/L (ref 135–146)
Total Bilirubin: 0.3 mg/dL (ref 0.2–1.1)
Total Protein: 6.8 g/dL (ref 6.3–8.2)
eGFR: 128 mL/min/{1.73_m2} (ref >=60)

## 2022-09-10 LAB — LIPID PANEL
Cholesterol: 159 mg/dL (ref <170)
HDL: 44 mg/dL — ABNORMAL LOW (ref >45)
LDL Cholesterol (Calc): 89 mg/dL (calc) (ref <110)
Non-HDL Cholesterol (Calc): 115 mg/dL (calc) (ref <120)
Total CHOL/HDL Ratio: 3.6 (calc) (ref <5.0)
Triglycerides: 159 mg/dL — ABNORMAL HIGH (ref <90)

## 2022-09-10 LAB — CBC WITH DIFFERENTIAL/PLATELET
Absolute Monocytes: 1161 cells/uL — ABNORMAL HIGH (ref 200–950)
Basophils Absolute: 95 cells/uL (ref 0–200)
Basophils Relative: 0.7 %
Eosinophils Absolute: 513 cells/uL — ABNORMAL HIGH (ref 15–500)
Eosinophils Relative: 3.8 %
HCT: 42.9 % (ref 35.0–45.0)
Hemoglobin: 14.8 g/dL (ref 11.7–15.5)
Lymphs Abs: 2930 cells/uL (ref 850–3900)
MCH: 29.7 pg (ref 27.0–33.0)
MCHC: 34.5 g/dL (ref 32.0–36.0)
MCV: 86.1 fL (ref 80.0–100.0)
MPV: 9.5 fL (ref 7.5–12.5)
Monocytes Relative: 8.6 %
Neutro Abs: 8802 cells/uL — ABNORMAL HIGH (ref 1500–7800)
Neutrophils Relative %: 65.2 %
Platelets: 339 10*3/uL (ref 140–400)
RBC: 4.98 10*6/uL (ref 3.80–5.10)
RDW: 11.7 % (ref 11.0–15.0)
Total Lymphocyte: 21.7 %
WBC: 13.5 10*3/uL — ABNORMAL HIGH (ref 3.8–10.8)

## 2022-09-10 LAB — HEMOGLOBIN A1C
Hgb A1c MFr Bld: 5.5 % of total Hgb (ref <5.7)
Mean Plasma Glucose: 111 mg/dL
eAG (mmol/L): 6.2 mmol/L

## 2022-09-12 ENCOUNTER — Other Ambulatory Visit: Payer: Self-pay | Admitting: Nurse Practitioner

## 2022-09-12 DIAGNOSIS — D72828 Other elevated white blood cell count: Secondary | ICD-10-CM

## 2022-09-13 ENCOUNTER — Ambulatory Visit (INDEPENDENT_AMBULATORY_CARE_PROVIDER_SITE_OTHER): Payer: BC Managed Care – PPO | Admitting: Nurse Practitioner

## 2022-09-13 ENCOUNTER — Other Ambulatory Visit: Payer: Self-pay

## 2022-09-13 ENCOUNTER — Encounter: Payer: Self-pay | Admitting: Nurse Practitioner

## 2022-09-13 DIAGNOSIS — J069 Acute upper respiratory infection, unspecified: Secondary | ICD-10-CM

## 2022-09-13 MED ORDER — BENZONATATE 100 MG PO CAPS: 200.0000 mg | ORAL_CAPSULE | Freq: Two times a day (BID) | ORAL | 0 refills | Status: DC | PRN

## 2022-09-13 MED ORDER — PROMETHAZINE-DM 6.25-15 MG/5ML PO SYRP: 5.0000 mL | ORAL_SOLUTION | Freq: Four times a day (QID) | ORAL | 0 refills | Status: DC | PRN

## 2022-09-13 MED ORDER — PREDNISONE 10 MG (21) PO TBPK: ORAL_TABLET | ORAL | 0 refills | Status: DC

## 2022-09-13 NOTE — Progress Notes (Signed)
Name: Colleen Le   MRN: [id number]    DOB: 05-Oct-2001   Date:09/13/2022       Progress Note  Subjective  Chief Complaint  Chief Complaint  Patient presents with   Sinusitis    Runny nose, sneezing, coughing for 1 week    I connected with  [name]  on 09/13/22 at  3:00 PM EST by a telephone enabled telemedicine application and verified that I am speaking with the correct person using two identifiers.  I discussed the limitations of evaluation and management by telemedicine and the availability of in person appointments. The patient expressed understanding and agreed to proceed with a virtual visit  Staff also discussed with the patient that there may be a patient responsible charge related to this service. Patient Location: home Provider Location: [facility] Additional Individuals present: alone  HPI  URI: Patient reports she has had symptoms for a week. She reports her symptoms include runny nose, sneezing, and coughing.  She denies any fever or shortness of breath.   She says she has taken zyrtec, flonase and mucinex. She says she has not gotten any better.  Discussed continuing with current treatment.  Will send in prescription for steroid taper, tessalon perls and phenergan DM.  Push fluids and get rest.   Patient Active Problem List   Diagnosis Date Noted   Tachycardia 09/09/2022   Chronic migraine without aura without status migrainosus, not intractable 09/09/2022   Pars defect of lumbar spine 06/09/2022   Class 1 obesity due to excess calories without serious comorbidity with body mass index (BMI) of 33.0 to 33.9 in adult 03/30/2022   Essential hypertension 01/03/2022    Social History   Tobacco Use   Smoking status: Never   Smokeless tobacco: Never  Substance Use Topics   Alcohol use: Yes     Current Outpatient Medications:    benzonatate (TESSALON) 100 MG capsule, Take 2 capsules (200 mg total) by mouth 2 (two) times daily as needed for cough., Disp: 20  capsule, Rfl: 0   cetirizine (ZYRTEC) 10 MG tablet, Take 10 mg by mouth daily., Disp: , Rfl:    hydrochlorothiazide (HYDRODIURIL) 25 MG tablet, Take 1 tablet (25 mg total) by mouth daily., Disp: 30 tablet, Rfl: 2   predniSONE (STERAPRED UNI-PAK 21 TAB) 10 MG (21) TBPK tablet, Take as directed on package.  (60 mg po on day 1, 50 mg po on day 2...), Disp: 21 tablet, Rfl: 0   promethazine-dextromethorphan (PROMETHAZINE-DM) 6.25-15 MG/5ML syrup, Take 5 mLs by mouth 4 (four) times daily as needed for cough., Disp: 118 mL, Rfl: 0   propranolol (INDERAL) 40 MG tablet, Take 1 tablet (40 mg total) by mouth 2 (two) times daily., Disp: 60 tablet, Rfl: 2   SUMAtriptan (IMITREX) 25 MG tablet, Take 1 tablet (25 mg total) by mouth every 2 (two) hours as needed for migraine. May repeat in 2 hours if headache persists or recurs., Disp: 10 tablet, Rfl: 0  No Known Allergies  I personally reviewed active problem list, medication list, allergies, notes from last encounter with the patient/caregiver today.  ROS  Constitutional: Negative for fever or weight change.  HEENT: nasal drainage, sneezing Respiratory: positive for cough and negative for shortness of breath.   Cardiovascular: Negative for chest pain or palpitations.  Gastrointestinal: Negative for abdominal pain, no bowel changes.  Musculoskeletal: Negative for gait problem or joint swelling.  Skin: Negative for rash.  Neurological: Negative for dizziness or headache.  No other  specific complaints in a complete review of systems (except as listed in HPI above).   Objective  Virtual encounter, vitals not obtained.  There is no height or weight on file to calculate BMI.  Nursing Note and Vital Signs reviewed.  Physical Exam  Awake alert and oriented, speaking in complete sentences  No results found for this or any previous visit (from the past 72 hour(s)).  Assessment & Plan  1. Viral upper respiratory tract infection Push fluids and get  rest Continue taking zyrtec, flonase and mucinex - benzonatate (TESSALON) 100 MG capsule; Take 2 capsules (200 mg total) by mouth 2 (two) times daily as needed for cough.  Dispense: 20 capsule; Refill: 0 - promethazine-dextromethorphan (PROMETHAZINE-DM) 6.25-15 MG/5ML syrup; Take 5 mLs by mouth 4 (four) times daily as needed for cough.  Dispense: 118 mL; Refill: 0 - predniSONE (STERAPRED UNI-PAK 21 TAB) 10 MG (21) TBPK tablet; Take as directed on package.  (60 mg po on day 1, 50 mg po on day 2...)  Dispense: 21 tablet; Refill: 0   -Red flags and when to present for emergency care or RTC including fever >101.54F, chest pain, shortness of breath, new/worsening/un-resolving symptoms,  reviewed with patient at time of visit. Follow up and care instructions discussed and provided in AVS. - I discussed the assessment and treatment plan with the patient. The patient was provided an opportunity to ask questions and all were answered. The patient agreed with the plan and demonstrated an understanding of the instructions.  I provided 15 minutes of non-face-to-face time during this encounter.  Berniece Salines, FNP

## 2022-11-02 ENCOUNTER — Encounter: Payer: Self-pay | Admitting: Nurse Practitioner

## 2022-11-03 ENCOUNTER — Ambulatory Visit: Payer: Self-pay

## 2022-11-03 ENCOUNTER — Other Ambulatory Visit: Payer: Self-pay | Admitting: Nurse Practitioner

## 2022-11-03 DIAGNOSIS — G43709 Chronic migraine without aura, not intractable, without status migrainosus: Secondary | ICD-10-CM

## 2022-11-03 MED ORDER — SUMATRIPTAN SUCCINATE 25 MG PO TABS: 25.0000 mg | ORAL_TABLET | ORAL | 0 refills | Status: DC | PRN

## 2022-11-03 NOTE — Telephone Encounter (Signed)
  Chief Complaint: HTN  Symptoms: SBP  140/150s, dizziness and hand and arm swelling Frequency: ongoing for several weeks  Pertinent Negatives: NA Disposition: [] ED /[] Urgent Care (no appt availability in office) / [x] Appointment(In office/virtual)/ []  [facility]/ [] Home Care/ [] Refused Recommended Disposition /[]  Mobile Bus/ []  Follow-up with PCP Additional Notes: pt states she has been taking her BP at home and SBP has been elevated, most times DBP is normal but occasionally but will 90-100s. Pt states she has been having dizziness spells and hands have been swelling more. Bought compression gloves and socks but doesn't really help. Pt states in the evenings her feet her so bad from swelling feels like walking on knives. Has appt for 11/14/22 d/t being off. 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 Advised pt to keep appt and if sx get worse before then to call back. She verbalized understanding.  Summary: BP issues and dizzy spells   Pt called and stated se was advised by office to schedule an appt / she scheduled appt for Jan 15th due to that being her day off/ she stated she has been having dizzy spells and was not dizzy at the time of the call but at random times / believe this was due to her ranges of BP/ please advise If needed     Reason for Disposition  [6] Systolic BP  >= 010 OR Diastolic >= 80 AND [9] taking BP medications  Answer Assessment - Initial Assessment Questions 1. BLOOD PRESSURE: "What is the blood pressure?" "Did you take at least two measurements 5 minutes apart?"     SBP 140-150s 2. ONSET: "When did you take your blood pressure?"     Ongoing for several weeks  3. HOW: "How did you take your blood pressure?" (e.g., automatic home BP monitor, visiting nurse)     Home BPs 4. HISTORY: "Do you have a history of high blood pressure?"     yes 5. MEDICINES: "Are you  taking any medicines for blood pressure?" "Have you missed any doses recently?"     yes 6. OTHER SYMPTOMS: "Do you have any symptoms?" (e.g., blurred vision, chest pain, difficulty breathing, headache, weakness)     Swelling in arms and legs and having dizziness spells  Protocols used: Blood Pressure - High-A-AH

## 2022-11-08 ENCOUNTER — Encounter: Payer: Self-pay | Admitting: Nurse Practitioner

## 2022-11-08 ENCOUNTER — Telehealth (INDEPENDENT_AMBULATORY_CARE_PROVIDER_SITE_OTHER): Payer: BC Managed Care – PPO | Admitting: Nurse Practitioner

## 2022-11-08 ENCOUNTER — Other Ambulatory Visit: Payer: Self-pay

## 2022-11-08 VITALS — BP 134/91 | HR 85

## 2022-11-08 DIAGNOSIS — R42 Dizziness and giddiness: Secondary | ICD-10-CM | POA: Diagnosis not present

## 2022-11-08 DIAGNOSIS — R601 Generalized edema: Secondary | ICD-10-CM

## 2022-11-08 DIAGNOSIS — I10 Essential (primary) hypertension: Secondary | ICD-10-CM

## 2022-11-08 NOTE — Assessment & Plan Note (Signed)
continue taking medications as prescribed, ordered renal ultra sound and referral to nephrology placed.

## 2022-11-08 NOTE — Progress Notes (Signed)
Name: Colleen Le   MRN: [id number]    DOB: 2001/10/06   Date:11/08/2022       Progress Note  Subjective  Chief Complaint  Chief Complaint  Patient presents with   Hypertension   Edema    Feet and leg swelling, worse recently    I connected with  Colleen Le  on 11/08/22 at 1:30 pm  by a video enabled telemedicine application and verified that I am speaking with the correct person using two identifiers.  I discussed the limitations of evaluation and management by telemedicine and the availability of in person appointments. The patient expressed understanding and agreed to proceed with a virtual visit  Staff also discussed with the patient that there may be a patient responsible charge related to this service. Patient Location: home Provider Location: [facility] Additional Individuals present: alone  HPI  HTN/dizziness/swelling:  patient reports her blood pressure today is 134/91. She says her highest blood pressure was 152/101 and had readings as low as 119/72. Heart rate readings have been 106-95.   She is currently taking hydrochlorothiazide 25 mg daily.  We started her on propanolol 40 mg two times a day to help with tachycardia and migraine prophylaxis. She says that now she has been having more dizziness. She says when she has had these dizzy spells her vitals have been 155/96, 106, 119/98, 95 and 146/85, 97.  Swelling: she says she has been having bilateral hand and feet swelling. She says that she has had this for years.  She says that it can get bad enough where she can not sometimes lose feeling in her hands and feet.   She says she called cardiology office and they said there was nothing they could do for her.  Contacted cardiology office and they said that she was to return for follow up in three months not as needed but that her blood pressure did not need to be managed by cardiology.  Discussed with patient that we need to do a further work up on her HTN  since she is still  having elevated readings on medication.  Will place referral to nephrology and get renal ultrasound. Discussed case with Dr. Carlynn Purl.  Will not change patient medications at this time.   She says she started taking asa 81 mg daily.  Omega 3 1039 mg, and  elderberry.  Patient Active Problem List   Diagnosis Date Noted   Tachycardia 09/09/2022   Chronic migraine without aura without status migrainosus, not intractable 09/09/2022   Pars defect of lumbar spine 06/09/2022   Class 1 obesity due to excess calories without serious comorbidity with body mass index (BMI) of 33.0 to 33.9 in adult 03/30/2022   Essential hypertension 01/03/2022    Social History   Tobacco Use   Smoking status: Never   Smokeless tobacco: Never  Substance Use Topics   Alcohol use: Yes     Current Outpatient Medications:    cetirizine (ZYRTEC) 10 MG tablet, Take 10 mg by mouth daily., Disp: , Rfl:    hydrochlorothiazide (HYDRODIURIL) 25 MG tablet, Take 1 tablet (25 mg total) by mouth daily., Disp: 30 tablet, Rfl: 2   propranolol (INDERAL) 40 MG tablet, Take 1 tablet (40 mg total) by mouth 2 (two) times daily., Disp: 60 tablet, Rfl: 2   SUMAtriptan (IMITREX) 25 MG tablet, Take 1 tablet (25 mg total) by mouth every 2 (two) hours as needed for migraine. May repeat in 2 hours if headache persists or recurs.,  Disp: 10 tablet, Rfl: 0   benzonatate (TESSALON) 100 MG capsule, Take 2 capsules (200 mg total) by mouth 2 (two) times daily as needed for cough. (Patient not taking: Reported on 11/08/2022), Disp: 20 capsule, Rfl: 0   predniSONE (STERAPRED UNI-PAK 21 TAB) 10 MG (21) TBPK tablet, Take as directed on package.  (60 mg po on day 1, 50 mg po on day 2...) (Patient not taking: Reported on 11/08/2022), Disp: 21 tablet, Rfl: 0   promethazine-dextromethorphan (PROMETHAZINE-DM) 6.25-15 MG/5ML syrup, Take 5 mLs by mouth 4 (four) times daily as needed for cough. (Patient not taking: Reported on 11/08/2022), Disp: 118 mL, Rfl: 0  No  Known Allergies  I personally reviewed active problem list, medication list, allergies, notes from last encounter with the patient/caregiver today.  ROS  Constitutional: Negative for fever or weight change.  Respiratory: Negative for cough and shortness of breath.   Cardiovascular: Negative for chest pain or palpitations.  Gastrointestinal: Negative for abdominal pain, no bowel changes.  Musculoskeletal: Negative for gait problem or joint swelling.  Skin: Negative for rash.  Neurological: positive for dizziness, positive for  headache. (Migraines) No other specific complaints in a complete review of systems (except as listed in HPI above).   Objective  Virtual encounter, vitals not obtained.  There is no height or weight on file to calculate BMI.  Nursing Note and Vital Signs reviewed.  Physical Exam  Awake, alert and oriented speaking in complete sentences.   No results found for this or any previous visit (from the past 72 hour(s)).  Assessment & Plan  Problem List Items Addressed This Visit       Cardiovascular and Mediastinum   Essential hypertension - Primary    continue taking medications as prescribed, ordered renal ultra sound and referral to nephrology placed.       Relevant Orders   US Renal   Ambulatory referral to Nephrology   Other Visit Diagnoses     Generalized edema       continue taking medications as prescribed, ordered renal ultra sound and referral to nephrology placed.   Dizziness       continue taking medications as prescribed, ordered renal ultra sound and referral to nephrology placed.        -Red flags and when to present for emergency care or RTC including fever >101.32F, chest pain, shortness of breath, new/worsening/un-resolving symptoms,  reviewed with patient at time of visit. Follow up and care instructions discussed and provided in AVS. - I discussed the assessment and treatment plan with the patient. The patient was provided an  opportunity to ask questions and all were answered. The patient agreed with the plan and demonstrated an understanding of the instructions.  I provided 20 minutes of non-face-to-face time during this encounter.  Bo Merino, FNP

## 2022-11-11 ENCOUNTER — Telehealth: Payer: Self-pay | Admitting: Nurse Practitioner

## 2022-11-11 ENCOUNTER — Other Ambulatory Visit: Payer: Self-pay

## 2022-11-11 DIAGNOSIS — I10 Essential (primary) hypertension: Secondary | ICD-10-CM

## 2022-11-11 NOTE — Telephone Encounter (Signed)
Spoke with mom and let her know Almyra Free did the [place] and nephrology referral to rule out any other causes for her hypertension. Due to patient's age and current medications Almyra Free wanted to make sure there wasn't any secondary causes to the increased blood pressure. Mother gave verbal understanding and said she'll keep the [place] appt and will just make payments. She also verbalized she will make sure to keep the nephrology appt.

## 2022-11-11 NOTE — Telephone Encounter (Signed)
Called and order changed

## 2022-11-11 NOTE — Telephone Encounter (Signed)
Copied from [place] [phone] [phone]. Topic: General - Inquiry >> Nov 11, 2022  9:02 AM Marcellus Scott wrote: Reason for CRM: [facility] Scheduling calling to get clarification on the order that was sent for the patient. She stated she needs to know if pt needs Renal Artery order says US Renal.  Stated pt has an appointment on Monday, and they may need to send her to a different location.  Requesting a callback today, if possible.  Please advise.

## 2022-11-11 NOTE — Telephone Encounter (Signed)
Copied from [place] [id number]. Topic: General - Other >> Nov 11, 2022 11:36 AM [place] B wrote: Reason for CRM: Patients mother called in wantin to know why she has to do another ultrasound test and does she really needs this, sinceit will cost 1100.00. Please call back

## 2022-11-14 ENCOUNTER — Ambulatory Visit
Admission: RE | Admit: 2022-11-14 | Discharge: 2022-11-14 | Disposition: A | Discharge status: Home / Self Care | Payer: BC Managed Care – PPO | Source: Ambulatory Visit | Attending: Internal Medicine | Admitting: Internal Medicine

## 2022-11-14 ENCOUNTER — Ambulatory Visit: Payer: BC Managed Care – PPO | Admitting: Nurse Practitioner

## 2022-11-14 ENCOUNTER — Ambulatory Visit: Admission: RE | Admit: 2022-11-14 | Payer: BC Managed Care – PPO | Source: Ambulatory Visit

## 2022-11-14 DIAGNOSIS — I10 Essential (primary) hypertension: Secondary | ICD-10-CM | POA: Diagnosis present

## 2022-11-24 ENCOUNTER — Ambulatory Visit: Payer: BC Managed Care – PPO | Admitting: Nurse Practitioner

## 2022-11-30 ENCOUNTER — Other Ambulatory Visit: Payer: Self-pay | Admitting: Nurse Practitioner

## 2022-11-30 DIAGNOSIS — G43709 Chronic migraine without aura, not intractable, without status migrainosus: Secondary | ICD-10-CM

## 2022-11-30 DIAGNOSIS — I10 Essential (primary) hypertension: Secondary | ICD-10-CM

## 2022-11-30 NOTE — Telephone Encounter (Signed)
Requested Prescriptions  Pending Prescriptions Disp Refills   propranolol (INDERAL) 40 MG tablet [Pharmacy Med Name: Propranolol HCl 40 MG Oral Tablet] 180 tablet 0    Sig: Take 1 tablet by mouth twice daily     Cardiovascular:  Beta Blockers Failed - 11/30/2022  6:50 AM      Failed - Last BP in normal range    BP Readings from Last 1 Encounters:  11/08/22 (!) 134/91         Passed - Last Heart Rate in normal range    Pulse Readings from Last 1 Encounters:  11/08/22 85         Passed - Valid encounter within last 6 months    Recent Outpatient Visits           3 weeks ago Essential hypertension   [facility] Bo Merino, FNP   2 months ago Viral upper respiratory tract infection   [place], FNP   2 months ago Essential hypertension   [place], FNP   5 months ago Annual physical exam   [facility] Delsa Grana, PA-C   8 months ago Essential hypertension   [facility] Bo Merino, FNP       Future Appointments             In 1 week Reece Packer, Myna Hidalgo, [facility], [facility]

## 2022-12-09 ENCOUNTER — Ambulatory Visit: Payer: BC Managed Care – PPO | Admitting: Nurse Practitioner

## 2022-12-09 ENCOUNTER — Encounter: Payer: Self-pay | Admitting: Nurse Practitioner

## 2022-12-09 ENCOUNTER — Other Ambulatory Visit: Payer: Self-pay

## 2022-12-09 VITALS — BP 124/82 | HR 100 | Resp 16 | Ht 66.0 in | Wt 230.1 lb

## 2022-12-09 DIAGNOSIS — Z3042 Encounter for surveillance of injectable contraceptive: Secondary | ICD-10-CM | POA: Diagnosis not present

## 2022-12-09 DIAGNOSIS — G43709 Chronic migraine without aura, not intractable, without status migrainosus: Secondary | ICD-10-CM | POA: Diagnosis not present

## 2022-12-09 DIAGNOSIS — I10 Essential (primary) hypertension: Secondary | ICD-10-CM | POA: Diagnosis not present

## 2022-12-09 DIAGNOSIS — R601 Generalized edema: Secondary | ICD-10-CM

## 2022-12-09 MED ORDER — QULIPTA 30 MG PO TABS: 30.0000 mg | ORAL_TABLET | Freq: Every day | ORAL | 2 refills | Status: DC

## 2022-12-09 MED ORDER — MEDROXYPROGESTERONE ACETATE 150 MG/ML IM SUSP
150.0000 mg | Freq: Once | INTRAMUSCULAR | Status: AC
  Administered 2022-12-09: 150 mg via INTRAMUSCULAR

## 2022-12-09 NOTE — Assessment & Plan Note (Signed)
Can still use imitrex for acute migraine, start taking qulipta for prevention.

## 2022-12-09 NOTE — Assessment & Plan Note (Signed)
Continue taking HCTZ 25 mg daily and propanolol 40 mg two times a day.  Will get urine microalbumin.

## 2022-12-09 NOTE — Progress Notes (Signed)
BP 124/82   Pulse 100   Resp 16   Ht 5' 6"$  (1.676 m)   Wt 230 lb 1.6 oz (104.4 kg)   SpO2 96%   BMI 37.14 kg/m    Subjective:    Patient ID: Colleen Le, female    DOB: 02/25/2001, 22 y.o.   MRN: [id number]  HPI: Colleen Le is a 22 y.o. female  Chief Complaint  Patient presents with   Contraception   Migraine    Hypertension/tachycardia/swelling: Patient has been struggling with her blood pressure.  She has been taking hydrochlorothiazide 25 mg daily and propranolol 40 mg 2 times a day.  Had a virtual appointment with me to discuss her blood pressure readings because she was getting 134/91, 152/101.  She has been seen at cardiology reported that she has follow-up appointment in 3 months but as far as her blood pressure was concerned they did not need to manage.  Ordered a renal ultrasound which was normal and placed a referral to nephrology. She reports she has an appointment in April. She says she has been really working on lifestyle modification.  She is working out and eating healthier. She says her blood pressure has been doing better.  Her blood pressure today is 124/82. She reports she is still having generalized edema in her hands and feet. She says she is not using salt in her food. Will get a urine microalbumin.    Migraine: Previously reported that she had about 15 migraines a month.  They were close to the front of her head.  She says sometimes it did cause some nausea.  She had tried migraine Excedrin with no improvement we started her on propranolol for prevention and Imitrex for abortive therapy.  Patient reports today that her migraines have gotten better.  She says she gets dizzy when she gets her migraines.  She says she gets 5-6 migraines a month.  Discussed trying qulipta for prevention.  Gave her sample and coupon card.   Relevant past medical, surgical, family and social history reviewed and updated as indicated. Interim medical history since our last  visit reviewed. Allergies and medications reviewed and updated.  Review of Systems  Constitutional: Negative for fever or weight change.  Respiratory: Negative for cough and shortness of breath.   Cardiovascular: Negative for chest pain or palpitations.  Gastrointestinal: Negative for abdominal pain, no bowel changes.  Musculoskeletal: Negative for gait problem or joint swelling.  Skin: Negative for rash.  Neurological: Negative for dizziness, positive for  headache.  No other specific complaints in a complete review of systems (except as listed in HPI above).      Objective:    BP 124/82   Pulse 100   Resp 16   Ht 5' 6"$  (1.676 m)   Wt 230 lb 1.6 oz (104.4 kg)   SpO2 96%   BMI 37.14 kg/m   Wt Readings from Last 3 Encounters:  12/09/22 230 lb 1.6 oz (104.4 kg)  09/09/22 213 lb 14.4 oz (97 kg) (98 %, Z= 2.13)*  06/09/22 206 lb 4.8 oz (93.6 kg) (98 %, Z= 2.03)*   * Growth percentiles are based on CDC (Girls, 2-20 Years) data.    Physical Exam  Constitutional: Patient appears well-developed and well-nourished. Obese  No distress.  HEENT: head atraumatic, normocephalic, pupils equal and reactive to light,  neck supple, throat within normal limits Cardiovascular: tachycardic rate, regular rhythm and normal heart sounds.  No murmur heard. No BLE edema. Pulmonary/Chest:  Effort normal and breath sounds normal. No respiratory distress. Abdominal: Soft.  There is no tenderness. Psychiatric: Patient has a normal mood and affect. behavior is normal. Judgment and thought content normal.   Assessment & Plan:   Problem List Items Addressed This Visit       Cardiovascular and Mediastinum   Essential hypertension - Primary    Continue taking HCTZ 25 mg daily and propanolol 40 mg two times a day.  Will get urine microalbumin.       Relevant Orders   Microalbumin / creatinine urine ratio   Chronic migraine without aura without status migrainosus, not intractable    Can still use  imitrex for acute migraine, start taking qulipta for prevention.        Relevant Medications   Atogepant (QULIPTA) 30 MG TABS   Other Visit Diagnoses     Encounter for surveillance of injectable contraceptive       Relevant Medications   medroxyPROGESTERone (DEPO-PROVERA) injection 150 mg (Completed)   Generalized edema       Continue taking HCTZ 25 mg daily and propanolol 40 mg two times a day.  Will get urine microalbumin.   Relevant Orders   Microalbumin / creatinine urine ratio        Follow up plan: Return in about 3 months (around 03/09/2023) for follow up.

## 2022-12-10 LAB — MICROALBUMIN / CREATININE URINE RATIO
Creatinine, Urine: 98 mg/dL (ref 20–275)
Microalb Creat Ratio: 4 mcg/mg creat (ref <30)
Microalb, Ur: 0.4 mg/dL

## 2023-01-11 ENCOUNTER — Telehealth (INDEPENDENT_AMBULATORY_CARE_PROVIDER_SITE_OTHER): Payer: BC Managed Care – PPO | Admitting: Family Medicine

## 2023-01-11 DIAGNOSIS — U071 COVID-19: Secondary | ICD-10-CM | POA: Diagnosis not present

## 2023-01-11 DIAGNOSIS — J069 Acute upper respiratory infection, unspecified: Secondary | ICD-10-CM

## 2023-01-11 MED ORDER — ALBUTEROL SULFATE HFA 108 (90 BASE) MCG/ACT IN AERS: 2.0000 | INHALATION_SPRAY | Freq: Four times a day (QID) | RESPIRATORY_TRACT | 0 refills | Status: DC | PRN

## 2023-01-11 MED ORDER — PROMETHAZINE-DM 6.25-15 MG/5ML PO SYRP: 2.5000 mL | ORAL_SOLUTION | Freq: Four times a day (QID) | ORAL | 0 refills | Status: DC | PRN

## 2023-01-11 MED ORDER — BENZONATATE 100 MG PO CAPS: 100.0000 mg | ORAL_CAPSULE | Freq: Three times a day (TID) | ORAL | 0 refills | Status: DC | PRN

## 2023-01-11 MED ORDER — NIRMATRELVIR/RITONAVIR (PAXLOVID)TABLET: 3.0000 | ORAL_TABLET | Freq: Two times a day (BID) | ORAL | 0 refills | Status: AC

## 2023-01-11 NOTE — Progress Notes (Signed)
Name: Colleen Le   MRN: [id number]    DOB: 09/06/2001   Date:01/11/2023       Progress Note  Subjective:    Chief Complaint  Chief Complaint  Patient presents with   Covid Positive    Today at home   Cough   Nasal Congestion   Fever    Sx since mon    I connected with  Loreli Slot  on 01/11/23 at  3:20 PM EDT by a video enabled telemedicine application and verified that I am speaking with the correct person using two identifiers.  I discussed the limitations of evaluation and management by telemedicine and the availability of in person appointments. The patient expressed understanding and agreed to proceed. Staff also discussed with the patient that there may be a patient responsible charge related to this service. Patient Location: home Provider Location: [facility] clinic Additional Individuals present: none  Cough Associated symptoms include a fever.  Fever  Associated symptoms include coughing.   + for COVID with home test - done today, fever and sx since Monday 2 days ago  Some cough, nasal drainage, congestion, mild SOB when going up the stairs, no CP, + body aches, chills/sweats, fever Hx of childhood asthma, not needing inhalers in several years, no hx of pneumonia  Patient Active Problem List   Diagnosis Date Noted   Tachycardia 09/09/2022   Chronic migraine without aura without status migrainosus, not intractable 09/09/2022   Pars defect of lumbar spine 06/09/2022   Class 1 obesity due to excess calories without serious comorbidity with body mass index (BMI) of 33.0 to 33.9 in adult 03/30/2022   Essential hypertension 01/03/2022    Social History   Tobacco Use   Smoking status: Never   Smokeless tobacco: Never  Substance Use Topics   Alcohol use: Yes     Current Outpatient Medications:    Atogepant (QULIPTA) 30 MG TABS, Take 1 tablet (30 mg total) by mouth daily., Disp: 30 tablet, Rfl: 2   cetirizine (ZYRTEC) 10 MG tablet, Take 10 mg by mouth  daily., Disp: , Rfl:    hydrochlorothiazide (HYDRODIURIL) 25 MG tablet, Take 1 tablet (25 mg total) by mouth daily., Disp: 30 tablet, Rfl: 2   propranolol (INDERAL) 40 MG tablet, Take 1 tablet by mouth twice daily, Disp: 180 tablet, Rfl: 0   SUMAtriptan (IMITREX) 25 MG tablet, Take 1 tablet (25 mg total) by mouth every 2 (two) hours as needed for migraine. May repeat in 2 hours if headache persists or recurs., Disp: 10 tablet, Rfl: 0  No Known Allergies  I personally reviewed active problem list, medication list, allergies, family history, social history, health maintenance, notes from last encounter, lab results, imaging with the patient/caregiver today.   Review of Systems  Constitutional:  Positive for fever.  HENT: Negative.    Eyes: Negative.   Respiratory:  Positive for cough.   Cardiovascular: Negative.   Gastrointestinal: Negative.   Endocrine: Negative.   Genitourinary: Negative.   Musculoskeletal: Negative.   Skin: Negative.   Allergic/Immunologic: Negative.   Neurological: Negative.   Hematological: Negative.   Psychiatric/Behavioral: Negative.    All other systems reviewed and are negative.     Objective:   Virtual encounter, vitals limited, only able to obtain the following There were no vitals filed for this visit. There is no height or weight on file to calculate BMI. Nursing Note and Vital Signs reviewed.  Physical Exam Vitals and nursing note reviewed.  Constitutional:  General: She is not in acute distress.    Appearance: She is obese. She is not ill-appearing, toxic-appearing or diaphoretic.  Pulmonary:     Effort: No respiratory distress.     Comments: Frequent coughing with some coughing fits, but able to speak in full sentences, no audible wheeze or stridor, no retractions visualized Neurological:     Mental Status: She is alert.     PE limited by virtual encounter  No results found for this or any previous visit (from the past 72  hour(s)).  Assessment and Plan:     ICD-10-CM   1. Upper respiratory tract infection due to COVID-19 virus  U07.1 nirmatrelvir/ritonavir (PAXLOVID) 20 x 150 MG & 10 x '100MG'$  TABS   J06.9 albuterol (VENTOLIN HFA) 108 (90 Base) MCG/ACT inhaler    benzonatate (TESSALON) 100 MG capsule    promethazine-dextromethorphan (PROMETHAZINE-DM) 6.25-15 MG/5ML syrup    Supportive and sx measures reviewed with pt Out of work until next Monday and can return to work with mask x 5 d as long as respiratory sx are improving and she is fever free for more than 24 hours.    -Red flags and when to present for emergency care or RTC including fever >101.63F, chest pain, shortness of breath, new/worsening/un-resolving symptoms, reviewed with patient at time of visit. Follow up and care instructions discussed and provided in AVS. - I discussed the assessment and treatment plan with the patient. The patient was provided an opportunity to ask questions and all were answered. The patient agreed with the plan and demonstrated an understanding of the instructions.  I provided 15 minutes of non-face-to-face time during this encounter.  Delsa Grana, PA-C 01/11/23 2:56 PM

## 2023-03-03 ENCOUNTER — Ambulatory Visit: Payer: BC Managed Care – PPO

## 2023-03-04 ENCOUNTER — Other Ambulatory Visit: Payer: Self-pay | Admitting: Nurse Practitioner

## 2023-03-04 DIAGNOSIS — G43709 Chronic migraine without aura, not intractable, without status migrainosus: Secondary | ICD-10-CM

## 2023-03-05 ENCOUNTER — Other Ambulatory Visit: Payer: Self-pay | Admitting: Nurse Practitioner

## 2023-03-05 DIAGNOSIS — G43709 Chronic migraine without aura, not intractable, without status migrainosus: Secondary | ICD-10-CM

## 2023-03-05 DIAGNOSIS — I10 Essential (primary) hypertension: Secondary | ICD-10-CM

## 2023-03-06 NOTE — Telephone Encounter (Signed)
Requested medications are due for refill today.  yes  Requested medications are on the active medications list.  yes  Last refill. 12/09/2022 #30 2 rf  Future visit scheduled.   no  Notes to clinic.  Medication not assigned to a protocol. Please review for refill.    Requested Prescriptions  Pending Prescriptions Disp Refills   QULIPTA 30 MG TABS [Pharmacy Med Name: Qulipta 30 MG Oral Tablet] 30 tablet 0    Sig: Take 1 tablet by mouth once daily     Off-Protocol Failed - 03/04/2023  9:18 AM      Failed - Medication not assigned to a protocol, review manually.      Passed - Valid encounter within last 12 months    Recent Outpatient Visits           1 month ago Upper respiratory tract infection due to COVID-19 virus   [facility] Danelle Berry, PA-C   2 months ago Essential hypertension   [facility] Berniece Salines, FNP   3 months ago Essential hypertension   [facility] Berniece Salines, FNP   5 months ago Viral upper respiratory tract infection   [facility] Berniece Salines, FNP   5 months ago Essential hypertension   [facility] Berniece Salines, [place]

## 2023-03-06 NOTE — Telephone Encounter (Signed)
Requested Prescriptions  Pending Prescriptions Disp Refills   propranolol (INDERAL) 40 MG tablet [Pharmacy Med Name: Propranolol HCl 40 MG Oral Tablet] 180 tablet 1    Sig: Take 1 tablet by mouth twice daily     Cardiovascular:  Beta Blockers Passed - 03/05/2023  8:03 AM      Passed - Last BP in normal range    BP Readings from Last 1 Encounters:  12/09/22 124/82         Passed - Last Heart Rate in normal range    Pulse Readings from Last 1 Encounters:  12/09/22 100         Passed - Valid encounter within last 6 months    Recent Outpatient Visits           1 month ago Upper respiratory tract infection due to COVID-19 virus   [facility] Danelle Berry, PA-C   2 months ago Essential hypertension   [facility] Berniece Salines, FNP   3 months ago Essential hypertension   [facility] Berniece Salines, FNP   5 months ago Viral upper respiratory tract infection   [facility] Berniece Salines, FNP   5 months ago Essential hypertension   [facility] Berniece Salines, [place]

## 2023-03-08 ENCOUNTER — Other Ambulatory Visit: Payer: Self-pay | Admitting: Nurse Practitioner

## 2023-03-08 ENCOUNTER — Encounter: Payer: Self-pay | Admitting: Nurse Practitioner

## 2023-03-08 DIAGNOSIS — G43709 Chronic migraine without aura, not intractable, without status migrainosus: Secondary | ICD-10-CM

## 2023-03-08 MED ORDER — QULIPTA 30 MG PO TABS: 30.0000 mg | ORAL_TABLET | Freq: Every day | ORAL | 2 refills | Status: DC

## 2023-03-08 MED ORDER — SUMATRIPTAN SUCCINATE 25 MG PO TABS: 25.0000 mg | ORAL_TABLET | ORAL | 0 refills | Status: DC | PRN

## 2023-04-05 IMAGING — CT CT HEAD W/O CM
4 series · 16 of 47 positions shown, 18 images · non-contrast
Comparison: None.

CLINICAL DATA: Headache and visual disturbancesHeadache, sudden,
severe



[Series 2: head bone · axial · 0.40mm/px · z∈[-127,-99]mm · 3 of 71 slices shown]
[im 8/71  bone]
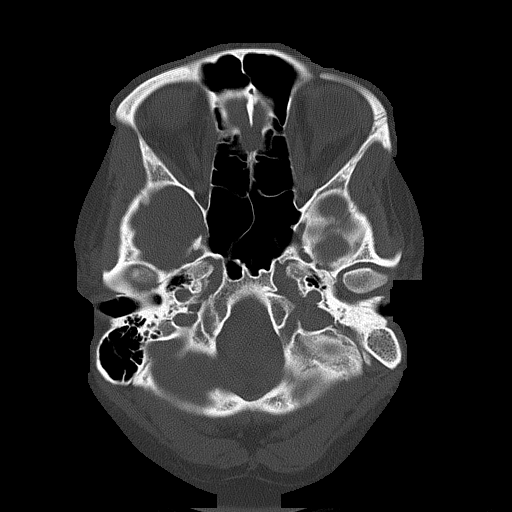
[im 15/71  bone]
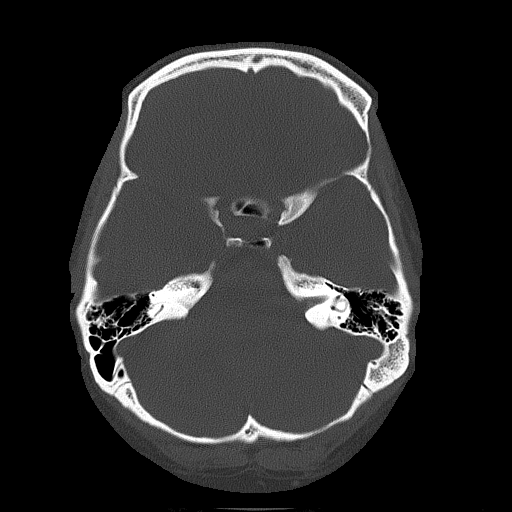
[im 22/71  bone]
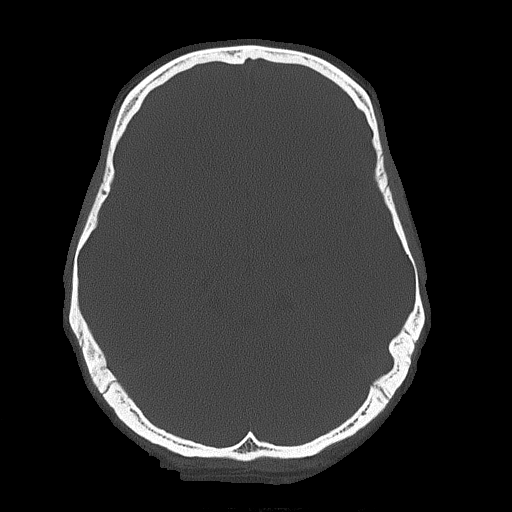

[Series 3: head wo · axial · 0.40mm/px · z∈[-126,-21]mm · 7 of 29 slices shown, 9 images]
[im 4/29  brain]
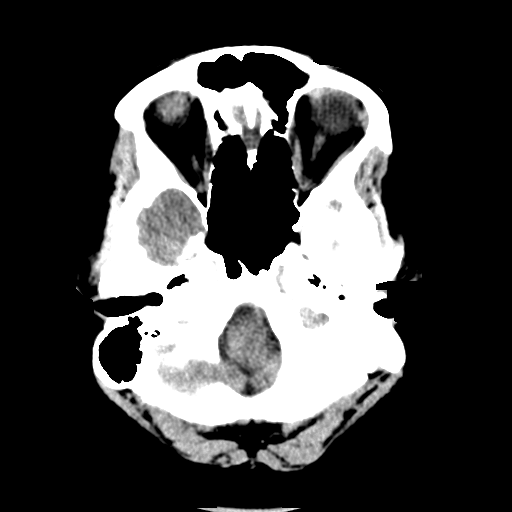
[im 4/29  bone]
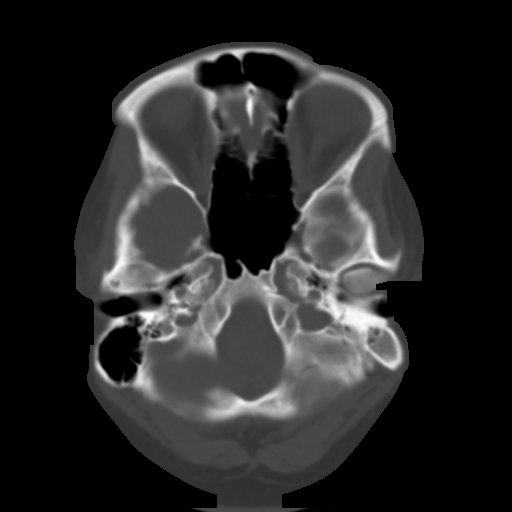
[im 8/29  brain]
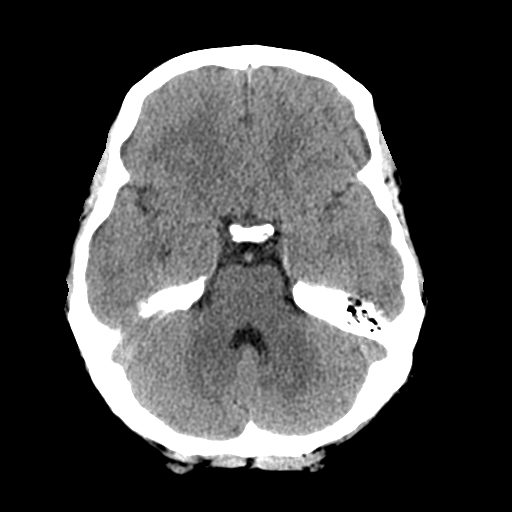
[im 11/29  brain]
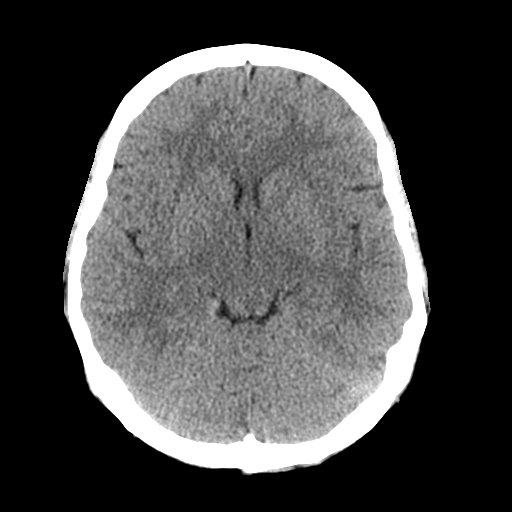
[im 15/29  brain]
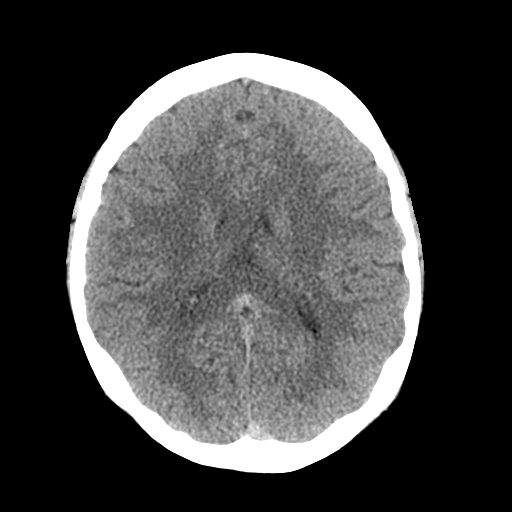
[im 18/29  brain]
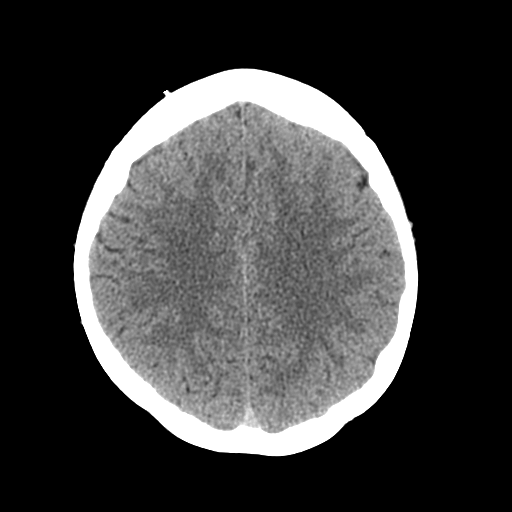
[im 18/29  bone]
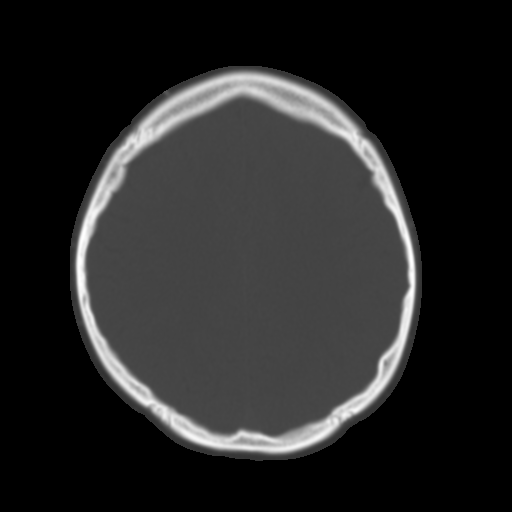
[im 22/29  brain]
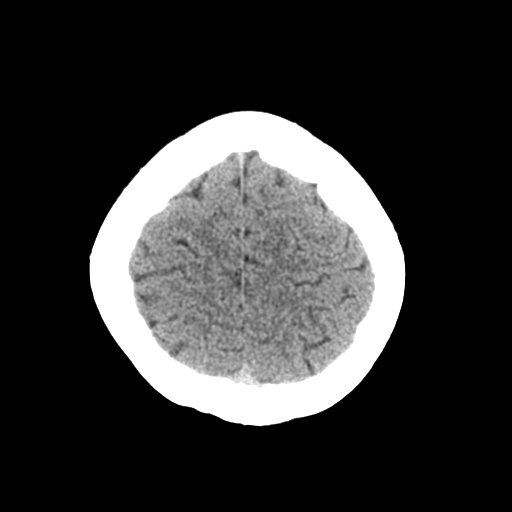
[im 25/29  brain]
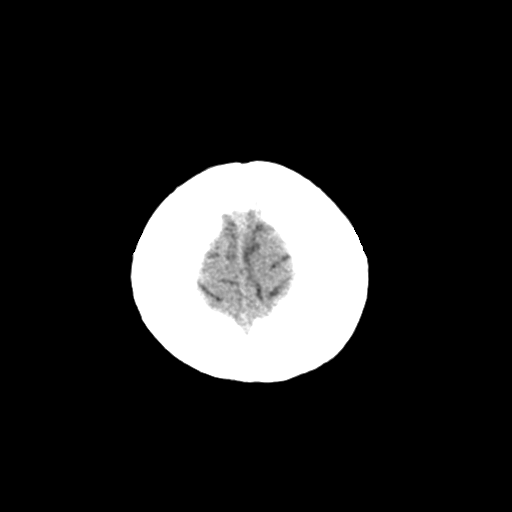

[Series 4: coronal soft tissue · coronal · 0.29mm/px · 3 of 64 slices shown]
[im 22/64  brain]
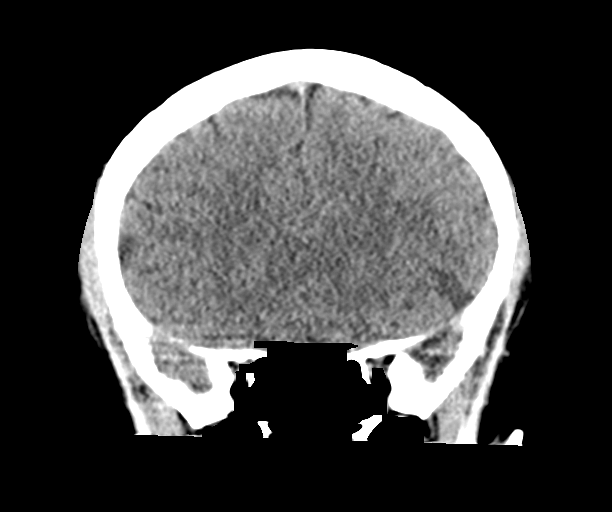
[im 29/64  brain]
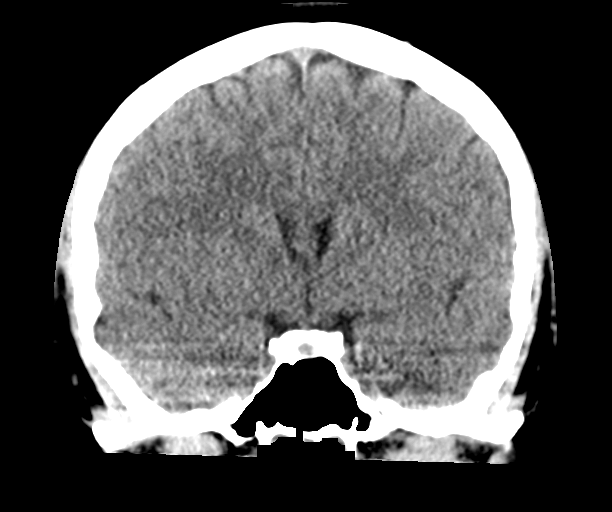
[im 36/64  brain]
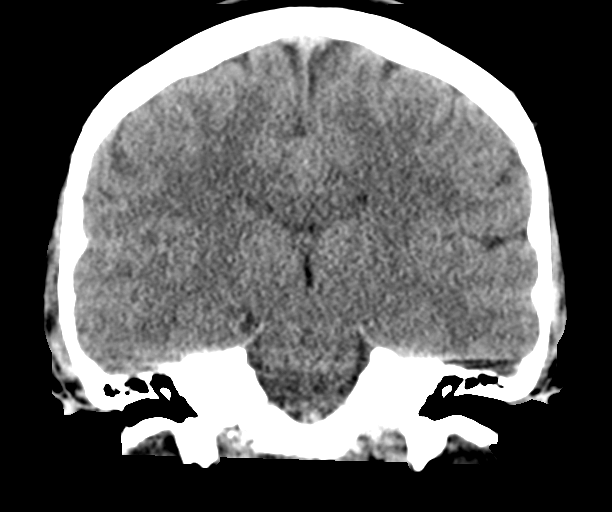

[Series 5: sagittal soft tissue · sagittal · 0.29mm/px · 3 of 60 slices shown]
[im 20/60  brain]
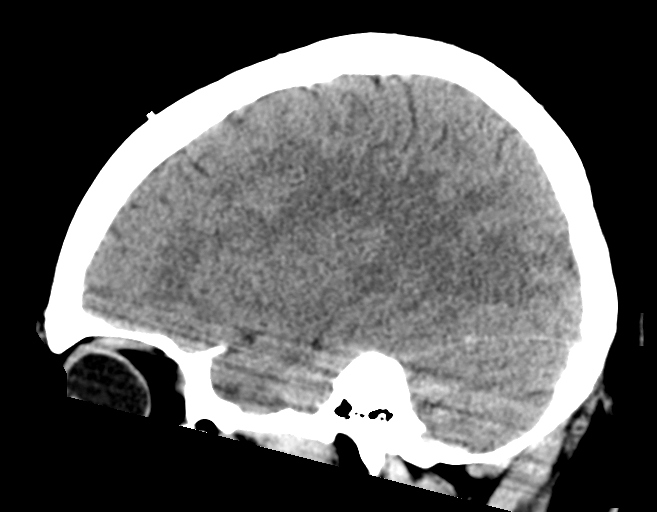
[im 30/60  brain]
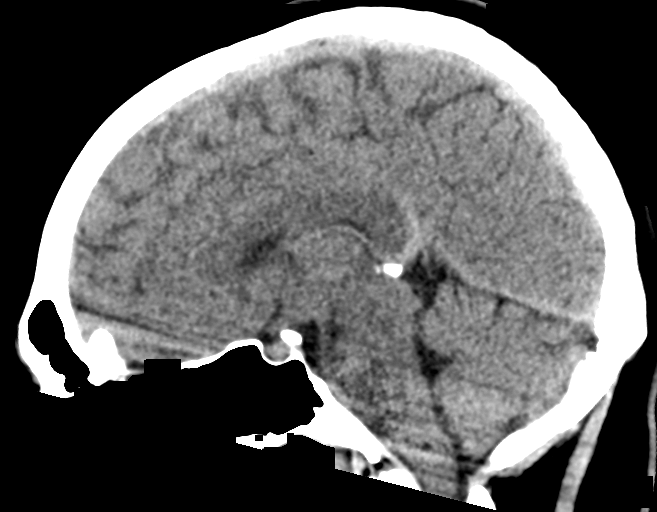
[im 40/60  brain]
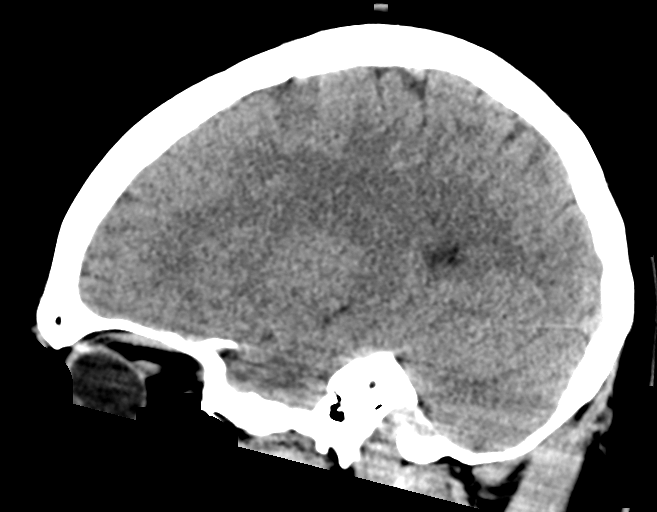

[16 of 47 positions shown; findings below may reference images not displayed]

FINDINGS: Brain: No acute intracranial hemorrhage. No focal mass lesion. No CT
evidence of acute infarction. No midline shift or mass effect. No
hydrocephalus. Basilar cisterns are patent.

Vascular: No hyperdense vessel or unexpected calcification.

Skull: Normal. Negative for fracture or focal lesion.

Sinuses/Orbits: Paranasal sinuses and mastoid air cells are clear.
Orbits are clear.

Other: None.
IMPRESSION: Normal head CT.

## 2023-04-10 ENCOUNTER — Encounter: Payer: Self-pay | Admitting: Nurse Practitioner

## 2023-04-10 ENCOUNTER — Other Ambulatory Visit: Payer: Self-pay

## 2023-04-10 DIAGNOSIS — I10 Essential (primary) hypertension: Secondary | ICD-10-CM

## 2023-04-10 MED ORDER — HYDROCHLOROTHIAZIDE 25 MG PO TABS: 25.0000 mg | ORAL_TABLET | Freq: Every day | ORAL | 1 refills | Status: DC

## 2023-06-01 ENCOUNTER — Other Ambulatory Visit: Payer: Self-pay | Admitting: Nurse Practitioner

## 2023-06-01 DIAGNOSIS — G43709 Chronic migraine without aura, not intractable, without status migrainosus: Secondary | ICD-10-CM

## 2023-06-01 NOTE — Telephone Encounter (Signed)
Requested medications are due for refill today.  yes  Requested medications are on the active medications list.  yes  Last refill. 03/08/2023 #30 2 rf  Future visit scheduled.   no  Notes to clinic.  Medication not assigned to a protocol . Please review for refill.    Requested Prescriptions  Pending Prescriptions Disp Refills   QULIPTA 30 MG TABS [Pharmacy Med Name: Qulipta 30 MG Oral Tablet] 30 tablet 0    Sig: Take 1 tablet by mouth once daily     Off-Protocol Failed - 06/01/2023  9:45 AM      Failed - Medication not assigned to a protocol, review manually.      Passed - Valid encounter within last 12 months    Recent Outpatient Visits           4 months ago Upper respiratory tract infection due to COVID-19 virus   [facility] Danelle Berry, PA-C   5 months ago Essential hypertension   [facility] Berniece Salines, FNP   6 months ago Essential hypertension   [facility] Berniece Salines, FNP   8 months ago Viral upper respiratory tract infection   [facility] Berniece Salines, FNP   8 months ago Essential hypertension   [facility] Berniece Salines, [place]

## 2023-06-08 ENCOUNTER — Encounter: Payer: Self-pay | Admitting: Nurse Practitioner

## 2023-06-08 ENCOUNTER — Other Ambulatory Visit: Payer: Self-pay | Admitting: Nurse Practitioner

## 2023-06-08 DIAGNOSIS — G43709 Chronic migraine without aura, not intractable, without status migrainosus: Secondary | ICD-10-CM

## 2023-06-08 MED ORDER — QULIPTA 30 MG PO TABS: 30.0000 mg | ORAL_TABLET | Freq: Every day | ORAL | 2 refills | Status: DC

## 2023-07-06 ENCOUNTER — Encounter: Payer: Self-pay | Admitting: Nurse Practitioner

## 2023-08-07 ENCOUNTER — Encounter: Payer: Self-pay | Admitting: Nurse Practitioner

## 2023-10-20 ENCOUNTER — Telehealth: Payer: Self-pay | Admitting: Nurse Practitioner

## 2023-10-20 ENCOUNTER — Encounter: Payer: Self-pay | Admitting: Nurse Practitioner

## 2023-10-20 NOTE — Telephone Encounter (Signed)
Pt is calling to report that she has updated insurance [employer]. Pt will upload insurance into MyChart. Requesting PA on Atogepant (QULIPTA) 30 MG TABS [[id number]]  Please advise CB- [phone]

## 2023-10-20 NOTE — Telephone Encounter (Signed)
PA initiated through cover my meds with new insurance information. Awaiting determination.

## 2023-11-22 ENCOUNTER — Other Ambulatory Visit: Payer: Self-pay | Admitting: Nurse Practitioner

## 2023-11-22 DIAGNOSIS — I10 Essential (primary) hypertension: Secondary | ICD-10-CM

## 2023-11-22 DIAGNOSIS — G43709 Chronic migraine without aura, not intractable, without status migrainosus: Secondary | ICD-10-CM

## 2023-11-22 NOTE — Telephone Encounter (Signed)
OV needed for additional refills,30 day supply given until OV can be made.  Requested Prescriptions  Pending Prescriptions Disp Refills   hydrochlorothiazide (HYDRODIURIL) 25 MG tablet [Pharmacy Med Name: hydroCHLOROthiazide 25 MG Oral Tablet] 30 tablet 0    Sig: Take 1 tablet by mouth once daily     Cardiovascular: Diuretics - Thiazide Failed - 11/22/2023  1:54 PM      Failed - Cr in normal range and within 180 days    Creat  Date Value Ref Range Status  09/09/2022 0.70 0.50 - 0.96 mg/dL Final   Creatinine, Urine  Date Value Ref Range Status  12/09/2022 98 20 - 275 mg/dL Final         Failed - K in normal range and within 180 days    Potassium  Date Value Ref Range Status  09/09/2022 3.8 3.8 - 5.1 mmol/L Final         Failed - Na in normal range and within 180 days    Sodium  Date Value Ref Range Status  09/09/2022 138 135 - 146 mmol/L Final         Failed - Valid encounter within last 6 months    Recent Outpatient Visits           10 months ago Upper respiratory tract infection due to COVID-19 virus   [facility] Danelle Berry, PA-C   11 months ago Essential hypertension   [facility] Berniece Salines, FNP   1 year ago Essential hypertension   [facility] Berniece Salines, FNP   1 year ago Viral upper respiratory tract infection   [facility] Berniece Salines, FNP   1 year ago Essential hypertension   [facility] Berniece Salines, FNP              Passed - Last BP in normal range    BP Readings from Last 1 Encounters:  12/09/22 124/82          propranolol (INDERAL) 40 MG tablet [Pharmacy Med Name: Propranolol HCl 40 MG Oral Tablet] 60 tablet 0    Sig: Take 1 tablet by mouth twice daily     Cardiovascular:  Beta Blockers Failed - 11/22/2023  1:54 PM      Failed - Valid encounter within last 6 months    Recent Outpatient Visits            10 months ago Upper respiratory tract infection due to COVID-19 virus   [facility] Danelle Berry, PA-C   11 months ago Essential hypertension   [facility] Berniece Salines, FNP   1 year ago Essential hypertension   [facility] Berniece Salines, FNP   1 year ago Viral upper respiratory tract infection   [facility] Berniece Salines, FNP   1 year ago Essential hypertension   [facility] Berniece Salines, FNP              Passed - Last BP in normal range    BP Readings from Last 1 Encounters:  12/09/22 124/82         Passed - Last Heart Rate in normal range    Pulse Readings from Last 1 Encounters:  12/09/22 100

## 2023-11-27 ENCOUNTER — Encounter: Payer: Self-pay | Admitting: Family Medicine

## 2023-11-27 ENCOUNTER — Telehealth (INDEPENDENT_AMBULATORY_CARE_PROVIDER_SITE_OTHER): Payer: Medicaid Other | Admitting: Family Medicine

## 2023-11-27 DIAGNOSIS — R197 Diarrhea, unspecified: Secondary | ICD-10-CM | POA: Diagnosis not present

## 2023-11-27 DIAGNOSIS — G43709 Chronic migraine without aura, not intractable, without status migrainosus: Secondary | ICD-10-CM

## 2023-11-27 DIAGNOSIS — R110 Nausea: Secondary | ICD-10-CM | POA: Diagnosis not present

## 2023-11-27 DIAGNOSIS — J069 Acute upper respiratory infection, unspecified: Secondary | ICD-10-CM | POA: Diagnosis not present

## 2023-11-27 DIAGNOSIS — R0602 Shortness of breath: Secondary | ICD-10-CM

## 2023-11-27 MED ORDER — ALBUTEROL SULFATE HFA 108 (90 BASE) MCG/ACT IN AERS: 2.0000 | INHALATION_SPRAY | Freq: Four times a day (QID) | RESPIRATORY_TRACT | 0 refills | Status: AC | PRN

## 2023-11-27 MED ORDER — ONDANSETRON 4 MG PO TBDP: 4.0000 mg | ORAL_TABLET | Freq: Three times a day (TID) | ORAL | 0 refills | Status: AC | PRN

## 2023-11-27 MED ORDER — QULIPTA 30 MG PO TABS: 30.0000 mg | ORAL_TABLET | Freq: Every day | ORAL | 1 refills | Status: DC

## 2023-11-27 NOTE — Progress Notes (Signed)
Name: Colleen Le   MRN: [id number]    DOB: August 20, 2001   Date:11/27/2023       Progress Note  Subjective:    Chief Complaint  Chief Complaint  Patient presents with   Cough    x4 days, non-productive   Nasal Congestion   Diarrhea    Over the weekend    I connected with  [name]  on 11/27/23 at  1:40 PM EST by a video enabled telemedicine application and verified that I am speaking with the correct person using two identifiers.  I discussed the limitations of evaluation and management by telemedicine and the availability of in person appointments. The patient expressed understanding and agreed to proceed. Staff also discussed with the patient that there may be a patient responsible charge related to this service. Patient Location: home Provider Location: [facility] clinic office Additional Individuals present: none  Cough  Diarrhea  Associated symptoms include coughing.   Pt started having sx Friday night and much worse Saturday morning with nausea/gagging and diarrhea with also nasal drainage and some coughing No vomiting No fever   She asks about her BP meds - refilled on 1/22 And qulipta - Rx from Aug for #90 and 2 refills, but pt was getting dispensed #30 and insurance has changed  Patient Active Problem List   Diagnosis Date Noted   Tachycardia 09/09/2022   Chronic migraine without aura without status migrainosus, not intractable 09/09/2022   Pars defect of lumbar spine 06/09/2022   Class 1 obesity due to excess calories without serious comorbidity with body mass index (BMI) of 33.0 to 33.9 in adult 03/30/2022   Essential hypertension 01/03/2022    Social History   Tobacco Use   Smoking status: Never   Smokeless tobacco: Never  Substance Use Topics   Alcohol use: Yes     Current Outpatient Medications:    albuterol (VENTOLIN HFA) 108 (90 Base) MCG/ACT inhaler, Inhale 2 puffs into the lungs every 6 (six) hours as needed for wheezing or shortness of  breath., Disp: 8 g, Rfl: 0   Atogepant (QULIPTA) 30 MG TABS, Take 1 tablet (30 mg total) by mouth daily., Disp: 90 tablet, Rfl: 2   cetirizine (ZYRTEC) 10 MG tablet, Take 10 mg by mouth daily., Disp: , Rfl:    hydrochlorothiazide (HYDRODIURIL) 25 MG tablet, Take 1 tablet by mouth once daily, Disp: 30 tablet, Rfl: 0   propranolol (INDERAL) 40 MG tablet, Take 1 tablet by mouth twice daily, Disp: 60 tablet, Rfl: 0   SUMAtriptan (IMITREX) 25 MG tablet, Take 1 tablet (25 mg total) by mouth every 2 (two) hours as needed for migraine. May repeat in 2 hours if headache persists or recurs., Disp: 10 tablet, Rfl: 0   benzonatate (TESSALON) 100 MG capsule, Take 1 capsule (100 mg total) by mouth 3 (three) times daily as needed for cough. (Patient not taking: Reported on 11/27/2023), Disp: 30 capsule, Rfl: 0   promethazine-dextromethorphan (PROMETHAZINE-DM) 6.25-15 MG/5ML syrup, Take 2.5-5 mLs by mouth 4 (four) times daily as needed for cough. (Patient not taking: Reported on 11/27/2023), Disp: 118 mL, Rfl: 0  No Known Allergies    Review of Systems  Constitutional: Negative.   HENT: Negative.    Eyes: Negative.   Respiratory:  Positive for cough.   Cardiovascular: Negative.   Gastrointestinal:  Positive for diarrhea.  Endocrine: Negative.   Genitourinary: Negative.   Musculoskeletal: Negative.   Skin: Negative.   Allergic/Immunologic: Negative.   Neurological: Negative.  Hematological: Negative.   Psychiatric/Behavioral: Negative.    All other systems reviewed and are negative.     Objective:   Virtual encounter, vitals limited, only able to obtain the following There were no vitals filed for this visit. There is no height or weight on file to calculate BMI. Nursing Note and Vital Signs reviewed.  Physical Exam Vitals and nursing note reviewed.  Constitutional:      General: She is not in acute distress.    Appearance: She is obese. She is not ill-appearing, toxic-appearing or  diaphoretic.  Pulmonary:     Effort: Pulmonary effort is normal. No respiratory distress.  Neurological:     Mental Status: She is alert.  Psychiatric:        Mood and Affect: Mood normal.        Behavior: Behavior normal.     PE limited by virtual encounter  No results found for this or any previous visit (from the past 72 hours).  Assessment and Plan:   1. Nausea (Primary) No vomiting, very nauseated with food or thinking about eating, can try zofran and pepcid OTC  Slow progression of diet - reviewed with pt, encouraged pushing fluids with elecrolytes and progressing to crackers avoid fatty/fried/spicy food or dairy until feeling much better - ondansetron (ZOFRAN-ODT) 4 MG disintegrating tablet; Take 1 tablet (4 mg total) by mouth every 8 (eight) hours as needed for nausea or vomiting.  Dispense: 20 tablet; Refill: 0  2. Diarrhea, unspecified type Discussed diet progression - diarrhea has improved in the last 24 hours  3. Upper respiratory tract infection, unspecified type Coughing and getting SOB, inhaler helps a little but hers is almost out - albuterol (VENTOLIN HFA) 108 (90 Base) MCG/ACT inhaler; Inhale 2 puffs into the lungs every 6 (six) hours as needed for wheezing or shortness of breath.  Dispense: 8 g; Refill: 0  Med refills - sent in refill on qulipta - she has new insurance under a [employer] [place] med[place] plan - I explained it may need PA or not be preferred Her BP meds are prescribed and all meds have been confirmed received by pharmacy  -Red flags and when to present for emergency care or RTC including fever >101.24F, chest pain, shortness of breath, new/worsening/un-resolving symptoms, reviewed with patient at time of visit. Follow up and care instructions discussed and provided in AVS. - I discussed the assessment and treatment plan with the patient. The patient was provided an opportunity to ask questions and all were answered. The patient agreed with the plan  and demonstrated an understanding of the instructions.  I provided 20+ minutes of non-face-to-face time during this encounter.  Danelle Berry, PA-C 11/27/23 1:56 PM

## 2024-01-03 DIAGNOSIS — R519 Headache, unspecified: Secondary | ICD-10-CM | POA: Diagnosis not present

## 2024-01-03 DIAGNOSIS — S060X0A Concussion without loss of consciousness, initial encounter: Secondary | ICD-10-CM | POA: Diagnosis not present

## 2024-01-03 DIAGNOSIS — M542 Cervicalgia: Secondary | ICD-10-CM | POA: Diagnosis not present

## 2024-02-07 ENCOUNTER — Other Ambulatory Visit: Payer: Self-pay | Admitting: Nurse Practitioner

## 2024-02-07 DIAGNOSIS — G43709 Chronic migraine without aura, not intractable, without status migrainosus: Secondary | ICD-10-CM

## 2024-02-07 DIAGNOSIS — I10 Essential (primary) hypertension: Secondary | ICD-10-CM

## 2024-02-08 NOTE — Telephone Encounter (Signed)
 Requested medications are due for refill today.  yes  Requested medications are on the active medications list.  yes  Last refill. 11/22/2023 30 day supply  Future visit scheduled.   no  Notes to clinic.  Pt was last seen 11/2023 on an acute vv. Pt last seen in office 05/2023. Pt already given a courtesy refill.    Requested Prescriptions  Pending Prescriptions Disp Refills   hydrochlorothiazide (HYDRODIURIL) 25 MG tablet [Pharmacy Med Name: hydroCHLOROthiazide 25 MG Oral Tablet] 30 tablet 0    Sig: Take 1 tablet by mouth once daily     Cardiovascular: Diuretics - Thiazide Failed - 02/08/2024  8:34 AM      Failed - Cr in normal range and within 180 days    Creat  Date Value Ref Range Status  09/09/2022 0.70 0.50 - 0.96 mg/dL Final   Creatinine, Urine  Date Value Ref Range Status  12/09/2022 98 20 - 275 mg/dL Final         Failed - K in normal range and within 180 days    Potassium  Date Value Ref Range Status  09/09/2022 3.8 3.8 - 5.1 mmol/L Final         Failed - Na in normal range and within 180 days    Sodium  Date Value Ref Range Status  09/09/2022 138 135 - 146 mmol/L Final         Failed - Valid encounter within last 6 months    Recent Outpatient Visits   None            Passed - Last BP in normal range    BP Readings from Last 1 Encounters:  12/09/22 124/82          propranolol (INDERAL) 40 MG tablet [Pharmacy Med Name: Propranolol HCl 40 MG Oral Tablet] 60 tablet 0    Sig: Take 1 tablet by mouth twice daily     Cardiovascular:  Beta Blockers Failed - 02/08/2024  8:34 AM      Failed - Valid encounter within last 6 months    Recent Outpatient Visits   None            Passed - Last BP in normal range    BP Readings from Last 1 Encounters:  12/09/22 124/82         Passed - Last Heart Rate in normal range    Pulse Readings from Last 1 Encounters:  12/09/22 100

## 2024-03-15 ENCOUNTER — Ambulatory Visit: Admitting: Nurse Practitioner

## 2024-03-15 ENCOUNTER — Encounter: Payer: Self-pay | Admitting: Nurse Practitioner

## 2024-03-15 ENCOUNTER — Other Ambulatory Visit: Payer: Self-pay

## 2024-03-15 VITALS — BP 120/78 | HR 79 | Temp 97.7°F | Resp 16 | Ht 66.0 in | Wt 215.1 lb

## 2024-03-15 DIAGNOSIS — E66811 Obesity, class 1: Secondary | ICD-10-CM | POA: Diagnosis not present

## 2024-03-15 DIAGNOSIS — Z6833 Body mass index (BMI) 33.0-33.9, adult: Secondary | ICD-10-CM | POA: Diagnosis not present

## 2024-03-15 DIAGNOSIS — Z3009 Encounter for other general counseling and advice on contraception: Secondary | ICD-10-CM

## 2024-03-15 DIAGNOSIS — E6609 Other obesity due to excess calories: Secondary | ICD-10-CM | POA: Diagnosis not present

## 2024-03-15 DIAGNOSIS — Z13220 Encounter for screening for lipoid disorders: Secondary | ICD-10-CM | POA: Diagnosis not present

## 2024-03-15 DIAGNOSIS — Z131 Encounter for screening for diabetes mellitus: Secondary | ICD-10-CM | POA: Diagnosis not present

## 2024-03-15 DIAGNOSIS — I10 Essential (primary) hypertension: Secondary | ICD-10-CM | POA: Diagnosis not present

## 2024-03-15 DIAGNOSIS — G43709 Chronic migraine without aura, not intractable, without status migrainosus: Secondary | ICD-10-CM

## 2024-03-15 MED ORDER — SUMATRIPTAN SUCCINATE 25 MG PO TABS
25.0000 mg | ORAL_TABLET | ORAL | 0 refills | Status: DC | PRN
Start: 2024-03-15 — End: 2024-08-19

## 2024-03-15 MED ORDER — PROPRANOLOL HCL 40 MG PO TABS: 40.0000 mg | ORAL_TABLET | Freq: Two times a day (BID) | ORAL | 1 refills | Status: AC

## 2024-03-15 MED ORDER — QULIPTA 30 MG PO TABS: 30.0000 mg | ORAL_TABLET | Freq: Every day | ORAL | 1 refills | Status: AC

## 2024-03-15 MED ORDER — HYDROCHLOROTHIAZIDE 25 MG PO TABS: 25.0000 mg | ORAL_TABLET | Freq: Every day | ORAL | 1 refills | Status: DC
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
## 2024-03-16 LAB — CBC WITH DIFFERENTIAL/PLATELET
Absolute Lymphocytes: 3913 {cells}/uL — ABNORMAL HIGH (ref 850–3900)
Absolute Monocytes: 993 {cells}/uL — ABNORMAL HIGH (ref 200–950)
Basophils Absolute: 117 {cells}/uL (ref 0–200)
Basophils Relative: 0.8 %
Eosinophils Absolute: 555 {cells}/uL — ABNORMAL HIGH (ref 15–500)
Eosinophils Relative: 3.8 %
HCT: 46.9 % — ABNORMAL HIGH (ref 35.0–45.0)
Hemoglobin: 15.9 g/dL — ABNORMAL HIGH (ref 11.7–15.5)
MCH: 30 pg (ref 27.0–33.0)
MCHC: 33.9 g/dL (ref 32.0–36.0)
MCV: 88.5 fL (ref 80.0–100.0)
MPV: 9.2 fL (ref 7.5–12.5)
Monocytes Relative: 6.8 %
Neutro Abs: 9023 {cells}/uL — ABNORMAL HIGH (ref 1500–7800)
Neutrophils Relative %: 61.8 %
Platelets: 381 10*3/uL (ref 140–400)
RBC: 5.3 10*6/uL — ABNORMAL HIGH (ref 3.80–5.10)
RDW: 11.9 % (ref 11.0–15.0)
Total Lymphocyte: 26.8 %
WBC: 14.6 10*3/uL — ABNORMAL HIGH (ref 3.8–10.8)

## 2024-03-16 LAB — HEMOGLOBIN A1C
Hgb A1c MFr Bld: 5.3 % (ref <5.7)
Mean Plasma Glucose: 105 mg/dL
eAG (mmol/L): 5.8 mmol/L

## 2024-03-16 LAB — COMPREHENSIVE METABOLIC PANEL WITH GFR
AG Ratio: 1.8 (calc) (ref 1.0–2.5)
ALT: 59 U/L — ABNORMAL HIGH (ref 6–29)
AST: 29 U/L (ref 10–30)
Albumin: 4.6 g/dL (ref 3.6–5.1)
Alkaline phosphatase (APISO): 89 U/L (ref 31–125)
BUN: 7 mg/dL (ref 7–25)
CO2: 30 mmol/L (ref 20–32)
Calcium: 10.4 mg/dL — ABNORMAL HIGH (ref 8.6–10.2)
Chloride: 98 mmol/L (ref 98–110)
Creat: 0.8 mg/dL (ref 0.50–0.96)
Globulin: 2.6 g/dL (ref 1.9–3.7)
Glucose, Bld: 89 mg/dL (ref 65–99)
Potassium: 3.7 mmol/L (ref 3.5–5.3)
Sodium: 137 mmol/L (ref 135–146)
Total Bilirubin: 0.7 mg/dL (ref 0.2–1.2)
Total Protein: 7.2 g/dL (ref 6.1–8.1)
eGFR: 107 mL/min/{1.73_m2} (ref >=60)

## 2024-03-16 LAB — LIPID PANEL
Cholesterol: 168 mg/dL (ref <200)
HDL: 42 mg/dL — ABNORMAL LOW (ref >=50)
LDL Cholesterol (Calc): 100 mg/dL — ABNORMAL HIGH
Non-HDL Cholesterol (Calc): 126 mg/dL (ref <130)
Total CHOL/HDL Ratio: 4 (calc) (ref <5.0)
Triglycerides: 162 mg/dL — ABNORMAL HIGH (ref <150)

## 2024-03-18 ENCOUNTER — Ambulatory Visit: Payer: Self-pay | Admitting: Nurse Practitioner

## 2024-04-22 ENCOUNTER — Encounter: Payer: Self-pay | Admitting: Nurse Practitioner

## 2024-04-25 ENCOUNTER — Ambulatory Visit: Admitting: Nurse Practitioner

## 2024-04-25 ENCOUNTER — Encounter: Payer: Self-pay | Admitting: Nurse Practitioner

## 2024-04-25 VITALS — BP 126/78 | HR 80 | Temp 97.6°F | Resp 18 | Ht 66.0 in | Wt 209.3 lb

## 2024-04-25 DIAGNOSIS — G8929 Other chronic pain: Secondary | ICD-10-CM | POA: Diagnosis not present

## 2024-04-25 DIAGNOSIS — M4306 Spondylolysis, lumbar region: Secondary | ICD-10-CM | POA: Diagnosis not present

## 2024-04-25 DIAGNOSIS — M5441 Lumbago with sciatica, right side: Secondary | ICD-10-CM | POA: Diagnosis not present

## 2024-04-25 MED ORDER — GABAPENTIN 100 MG PO CAPS: 100.0000 mg | ORAL_CAPSULE | Freq: Three times a day (TID) | ORAL | 0 refills | Status: AC

## 2024-04-25 MED ORDER — PREDNISONE 10 MG (21) PO TBPK: ORAL_TABLET | ORAL | 0 refills | Status: DC

## 2024-04-25 NOTE — Progress Notes (Signed)
 BP 126/78   Pulse 80   Temp 97.6 F (36.4 C)   Resp 18   Ht 5' 6 (1.676 m)   Wt 209 lb 4.8 oz (94.9 kg)   LMP 04/01/2024   SpO2 94%   BMI 33.78 kg/m    Subjective:    Patient ID: Colleen Le, female    DOB: [birth year], 22 y.o.   MRN: [id number]  HPI: Colleen Le is a 23 y.o. female  Chief Complaint  Patient presents with   Back Pain    Worsening within the last 6 months whole back.  Has tried PT chiropractor with no releif    Discussed the use of AI scribe software for clinical note transcription with the patient, who gave verbal consent to proceed.  History of Present Illness Colleen Le is a 22 year old female with chronic low back pain who presents with worsening symptoms. She is accompanied by her boyfriend who assists with physical tasks due to her limitations.  She has chronic low back pain primarily on the right side, radiating down the right leg, often causing numbness. The pain is severe, impacting her ability to sit up, drive, or perform daily activities.  In 2020, imaging studies revealed grade one anterolisthesis of L5 on S1 with probable L5 pars defects. An MRI showed chronic bilateral L5 pars interarticular defects resulting in 0.3 cm anterolisthesis of L5 on S1, with open central canal and foramina at L5 S1, and a shallow central protrusion at L4-5 without central canal involvement.  She has undergone physical therapy and chiropractic treatment. Physical therapy initially worsened her condition, making ambulation difficult. Chiropractic care provided temporary relief for about two days, but the pain has since become unmanageable with no lasting benefit from recent sessions.  She denies any new trauma and has been cautious with lifting, adhering to a limit of 30 pounds. Recently, even lifting as little as five pounds exacerbates her pain significantly. She experiences difficulty sleeping due to discomfort, describing herself as a 'rotisserie  chicken' because she constantly changes positions to find comfort.  She has tried over-the-counter medications such as Tylenol and ibuprofen without relief. She has not previously used steroids for her condition.  No urinary incontinence. She reports muscle tightness depending on activity, particularly when standing for extended periods.         03/15/2024    1:03 PM 01/11/2023    1:51 PM 12/09/2022    3:40 PM  Depression screen PHQ 2/9  Decreased Interest 0 0 0  Down, Depressed, Hopeless 0 0 0  PHQ - 2 Score 0 0 0  Altered sleeping  0   Tired, decreased energy  0   Change in appetite  0   Feeling bad or failure about yourself   0   Trouble concentrating  0   Moving slowly or fidgety/restless  0   Suicidal thoughts  0   PHQ-9 Score  0   Difficult doing work/chores  Not difficult at all     Relevant past medical, surgical, family and social history reviewed and updated as indicated. Interim medical history since our last visit reviewed. Allergies and medications reviewed and updated.  Review of Systems  Ten systems reviewed and is negative except as mentioned in HPI      Objective:     BP 126/78   Pulse 80   Temp 97.6 F (36.4 C)   Resp 18   Ht 5' 6 (1.676 m)   Wt 209  lb 4.8 oz (94.9 kg)   LMP 04/01/2024   SpO2 94%   BMI 33.78 kg/m    Wt Readings from Last 3 Encounters:  04/25/24 209 lb 4.8 oz (94.9 kg)  03/15/24 215 lb 1.6 oz (97.6 kg)  12/09/22 230 lb 1.6 oz (104.4 kg)    Physical Exam Physical Exam GENERAL: Alert, cooperative, well developed, no acute distress HEENT: Normocephalic, normal oropharynx, moist mucous membranes CHEST: Clear to auscultation bilaterally, No wheezes, rhonchi, or crackles CARDIOVASCULAR: Normal heart rate and rhythm, S1 and S2 normal without murmurs ABDOMEN: Soft, non-tender, non-distended, without organomegaly, Normal bowel sounds EXTREMITIES: No cyanosis or edema NEUROLOGICAL: Cranial nerves grossly intact, Moves all  extremities without gross motor or sensory deficit   Results for orders placed or performed in visit on 03/15/24  CBC with Differential/Platelet   Collection Time: 03/15/24  1:29 PM  Result Value Ref Range   WBC 14.6 (H) 3.8 - 10.8 Thousand/uL   RBC 5.30 (H) 3.80 - 5.10 Million/uL   Hemoglobin 15.9 (H) 11.7 - 15.5 g/dL   HCT 53.0 (H) 64.9 - 54.9 %   MCV 88.5 80.0 - 100.0 fL   MCH 30.0 27.0 - 33.0 pg   MCHC 33.9 32.0 - 36.0 g/dL   RDW 88.0 88.9 - 84.9 %   Platelets 381 140 - 400 Thousand/uL   MPV 9.2 7.5 - 12.5 fL   Neutro Abs 9,023 (H) 1,500 - 7,800 cells/uL   Absolute Lymphocytes 3,913 (H) 850 - 3,900 cells/uL   Absolute Monocytes 993 (H) 200 - 950 cells/uL   Eosinophils Absolute 555 (H) 15 - 500 cells/uL   Basophils Absolute 117 0 - 200 cells/uL   Neutrophils Relative % 61.8 %   Total Lymphocyte 26.8 %   Monocytes Relative 6.8 %   Eosinophils Relative 3.8 %   Basophils Relative 0.8 %  Comprehensive metabolic panel with GFR   Collection Time: 03/15/24  1:29 PM  Result Value Ref Range   Glucose, Bld 89 65 - 99 mg/dL   BUN 7 7 - 25 mg/dL   Creat 9.19 9.49 - 9.03 mg/dL   eGFR 892 > OR = 60 fO/fpw/8.26f7   BUN/Creatinine Ratio SEE NOTE: 6 - 22 (calc)   Sodium 137 135 - 146 mmol/L   Potassium 3.7 3.5 - 5.3 mmol/L   Chloride 98 98 - 110 mmol/L   CO2 30 20 - 32 mmol/L   Calcium 10.4 (H) 8.6 - 10.2 mg/dL   Total Protein 7.2 6.1 - 8.1 g/dL   Albumin 4.6 3.6 - 5.1 g/dL   Globulin 2.6 1.9 - 3.7 g/dL (calc)   AG Ratio 1.8 1.0 - 2.5 (calc)   Total Bilirubin 0.7 0.2 - 1.2 mg/dL   Alkaline phosphatase (APISO) 89 31 - 125 U/L   AST 29 10 - 30 U/L   ALT 59 (H) 6 - 29 U/L  Lipid panel   Collection Time: 03/15/24  1:29 PM  Result Value Ref Range   Cholesterol 168 <200 mg/dL   HDL 42 (L) > OR = 50 mg/dL   Triglycerides 837 (H) <150 mg/dL   LDL Cholesterol (Calc) 100 (H) mg/dL (calc)   Total CHOL/HDL Ratio 4.0 <5.0 (calc)   Non-HDL Cholesterol (Calc) 126 <130 mg/dL (calc)   Hemoglobin J8r   Collection Time: 03/15/24  1:29 PM  Result Value Ref Range   Hgb A1c MFr Bld 5.3 <5.7 %   Mean Plasma Glucose 105 mg/dL   eAG (mmol/L) 5.8 mmol/L  Assessment & Plan:   Problem List Items Addressed This Visit       Nervous and Auditory   Chronic right-sided low back pain with right-sided sciatica   Relevant Medications   predniSONE  (STERAPRED UNI-PAK 21 TAB) 10 MG (21) TBPK tablet   gabapentin (NEURONTIN) 100 MG capsule   Other Relevant Orders   MR Lumbar Spine Wo Contrast     Musculoskeletal and Integument   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 Pain is exacerbated by activities such as sitting, driving, and lifting, and is not relieved by over-the-counter medications. Previous physical therapy and chiropractic care provided temporary relief but are no longer effective. No new trauma reported. No urinary incontinence or bowel dysfunction noted. 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 Current symptoms suggest possible progression or exacerbation of condition. Central canal and foramina remain open at L5-S1 based on previous MRI. - Monitor symptoms and reassess following MRI results - Consider referral to a specialist for further evaluation and management if symptoms persist or worsen        Follow up plan: Return if symptoms worsen or fail to improve.

## 2024-04-26 ENCOUNTER — Encounter: Payer: Self-pay | Admitting: Licensed Practical Nurse

## 2024-04-26 ENCOUNTER — Ambulatory Visit (INDEPENDENT_AMBULATORY_CARE_PROVIDER_SITE_OTHER): Admitting: Licensed Practical Nurse

## 2024-04-26 ENCOUNTER — Other Ambulatory Visit (HOSPITAL_COMMUNITY)
Admission: RE | Admit: 2024-04-26 | Discharge: 2024-04-26 | Disposition: A | Discharge status: Home / Self Care | Source: Ambulatory Visit | Attending: Licensed Practical Nurse | Admitting: Licensed Practical Nurse

## 2024-04-26 VITALS — BP 146/94 | HR 109 | Ht 66.0 in | Wt 209.3 lb

## 2024-04-26 DIAGNOSIS — Z124 Encounter for screening for malignant neoplasm of cervix: Secondary | ICD-10-CM | POA: Insufficient documentation

## 2024-04-26 DIAGNOSIS — Z30430 Encounter for insertion of intrauterine contraceptive device: Secondary | ICD-10-CM | POA: Diagnosis not present

## 2024-04-26 DIAGNOSIS — Z1272 Encounter for screening for malignant neoplasm of vagina: Secondary | ICD-10-CM | POA: Diagnosis not present

## 2024-04-26 DIAGNOSIS — Z01419 Encounter for gynecological examination (general) (routine) without abnormal findings: Secondary | ICD-10-CM | POA: Diagnosis not present

## 2024-04-26 DIAGNOSIS — Z3202 Encounter for pregnancy test, result negative: Secondary | ICD-10-CM | POA: Diagnosis not present

## 2024-04-26 LAB — POCT URINE PREGNANCY: Preg Test, Ur: NEGATIVE

## 2024-04-26 MED ORDER — LEVONORGESTREL 20 MCG/DAY IU IUD
1.0000 | INTRAUTERINE_SYSTEM | Freq: Once | INTRAUTERINE | Status: AC
  Administered 2024-04-26: 1 via INTRAUTERINE

## 2024-04-26 NOTE — Progress Notes (Addendum)
 Gareth Mliss FALCON, FNP   Chief Complaint  Patient presents with   Contraception    HPI:      Colleen Le is a 23 y.o. No obstetric history on file. whose LMP was Patient's last menstrual period was 04/01/2024 (exact date)., presents today for annual exam, here with her Partner Colleen Le.  - Interested in Mirena IUD today for heavy period bleeding. Reports monthly cycles, that are heavy for first 3 to 4 days, she uses about 10 to 12 superpads a day. They are painful for the first 3 days, with the 2nd day being terrible and unable to get out of bed.  - Not planning for pregnancy for a few years.  - Is sexually active, 1 partner, uses condoms.  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  -Reports increased in stress due to recent loss of her grandmothers. Feels ok with what has happened. Denies anxiety and depression. Reports feels well supported.  -Reports running and walking up until 2 weeks ago when her back started to hurt. Has an MRI scheduled.  -Reports diet is consist of fruits, vegetables. Trying to lose weight -Denies smoking, vaping, nicotine -Denies drug use -ETOH: socially, once a week, 1 cocktail or beer     Patient Active Problem List   Diagnosis Date Noted   Chronic right-sided low back pain with right-sided sciatica 04/25/2024   Tachycardia 09/09/2022   Chronic migraine without aura without status migrainosus, not intractable 09/09/2022   Pars defect of lumbar spine 06/09/2022   Class 1 obesity due to excess calories without serious comorbidity with body mass index (BMI) of 33.0 to 33.9 in adult 03/30/2022   Essential hypertension 01/03/2022    Past Surgical History:  Procedure Laterality Date   FRACTURE SURGERY     TONSILLECTOMY      Family History  Problem Relation Age of Onset   Diabetes Mother    Hypertension Mother     Hypertension Father    Diabetes Father     Social History   Socioeconomic History   Marital status: Single    Spouse name: Not on file   Number of children: Not on file   Years of education: Not on file   Highest education level: Not on file  Occupational History   Not on file  Tobacco Use   Smoking status: Never   Smokeless tobacco: Never  Vaping Use   Vaping status: Never Used  Substance and Sexual Activity   Alcohol use: Yes   Drug use: Never   Sexual activity: Yes  Other Topics Concern   Not on file  Social History Narrative   Not on file   Social Drivers of Health   Financial Resource Strain: Low Risk  (06/09/2022)   Overall Financial Resource Strain (CARDIA)    Difficulty of Paying Living Expenses: Not hard at all  Food Insecurity: No Food Insecurity (06/09/2022)   Hunger Vital Sign    Worried About Running Out of Food in the Last Year: Never true    Ran Out of Food in the Last Year: Never true  Transportation Needs: No Transportation Needs (06/09/2022)   PRAPARE - Administrator, Civil Service (Medical): No    Lack of Transportation (Non-Medical): No  Physical Activity: Insufficiently Active (06/09/2022)   Exercise Vital Sign    Days of  Exercise per Week: 3 days    Minutes of Exercise per Session: 30 min  Stress: No Stress Concern Present (06/09/2022)   [employer] of Occupational Health - Occupational Stress Questionnaire    Feeling of Stress : Only a little  Social Connections: Socially Isolated (06/09/2022)   Social Connection and Isolation Panel    Frequency of Communication with Friends and Family: More than three times a week    Frequency of Social Gatherings with Friends and Family: More than three times a week    Attends Religious Services: Never    Database administrator or Organizations: No    Attends Banker Meetings: Never    Marital Status: Never married  Intimate Partner Violence: Not At Risk (06/09/2022)    Humiliation, Afraid, Rape, and Kick questionnaire    Fear of Current or Ex-Partner: No    Emotionally Abused: No    Physically Abused: No    Sexually Abused: No    Outpatient Medications Prior to Visit  Medication Sig Dispense Refill   albuterol  (VENTOLIN  HFA) 108 (90 Base) MCG/ACT inhaler Inhale 2 puffs into the lungs every 6 (six) hours as needed for wheezing or shortness of breath. 8 g 0   Atogepant  (QULIPTA ) 30 MG TABS Take 1 tablet (30 mg total) by mouth daily. 90 tablet 1   cetirizine (ZYRTEC) 10 MG tablet Take 10 mg by mouth daily.     gabapentin  (NEURONTIN ) 100 MG capsule Take 1 capsule (100 mg total) by mouth 3 (three) times daily. 90 capsule 0   hydrochlorothiazide  (HYDRODIURIL ) 25 MG tablet Take 1 tablet (25 mg total) by mouth daily. 90 tablet 1   ondansetron  (ZOFRAN -ODT) 4 MG disintegrating tablet Take 1 tablet (4 mg total) by mouth every 8 (eight) hours as needed for nausea or vomiting. 20 tablet 0   predniSONE  (STERAPRED UNI-PAK 21 TAB) 10 MG (21) TBPK tablet Take as directed on package.  (60 mg po on day 1, 50 mg po on day 2...) 21 tablet 0   propranolol  (INDERAL ) 40 MG tablet Take 1 tablet (40 mg total) by mouth 2 (two) times daily. 180 tablet 1   SUMAtriptan  (IMITREX ) 25 MG tablet Take 1 tablet (25 mg total) by mouth every 2 (two) hours as needed for migraine. May repeat in 2 hours if headache persists or recurs. 10 tablet 0   No facility-administered medications prior to visit.      ROS:  Review of Systems  Constitutional: Negative.   HENT: Negative.    Eyes: Negative.   Respiratory: Negative.    Cardiovascular: Negative.   Gastrointestinal: Negative.   Endocrine: Negative.   Genitourinary: Negative.   Musculoskeletal: Negative.   Skin: Negative.   Allergic/Immunologic: Negative.   Neurological: Negative.   Hematological: Negative.   Psychiatric/Behavioral: Negative.    All other systems reviewed and are negative.    OBJECTIVE:   Vitals:  BP (!)  146/94 (BP Location: Left Arm, Patient Position: Sitting, Cuff Size: Normal)   Pulse (!) 109   Ht 5' 6 (1.676 m)   Wt 209 lb 4.8 oz (94.9 kg)   LMP 04/01/2024 (Exact Date)   BMI 33.78 kg/m   Physical Exam Constitutional:      Appearance: Normal appearance.  HENT:     Head: Normocephalic.     Nose: Nose normal.   Eyes:     Pupils: Pupils are equal, round, and reactive to light.    Cardiovascular:     Rate and Rhythm: Normal  rate and regular rhythm.     Pulses: Normal pulses.     Comments: Declines breast exam Pulmonary:     Effort: Pulmonary effort is normal.  Abdominal:     General: Abdomen is flat.  Genitourinary:    General: Normal vulva.     Rectum: Normal.     Comments: Bimanual: Non gravid uterus, non tender, no masses. Adnexa non tender no masses  Musculoskeletal:        General: Normal range of motion.     Cervical back: Normal range of motion.   Neurological:     Mental Status: She is alert and oriented to person, place, and time.   Psychiatric:        Mood and Affect: Mood normal.        Behavior: Behavior normal.     Results: Results for orders placed or performed in visit on 04/26/24 (from the past 24 hours)  POCT urine pregnancy     Status: None   Collection Time: 04/26/24  3:17 PM  Result Value Ref Range   Preg Test, Ur Negative Negative     Assessment/Plan: Well woman exam  Encounter for IUD insertion - Plan: levonorgestrel (MIRENA) 20 MCG/DAY IUD 1 each, POCT urine pregnancy  Cervical cancer screening - Plan: Cytology - PAP   GYNECOLOGY OFFICE PROCEDURE NOTE  Colleen Le is a 23 y.o. No obstetric history on file. here for mirena a IUD insertion. No GYN concerns.  Last pap smear was on never due to age.  The patient is currently using condoms for contraception and her LMP is Patient's last menstrual period was 04/01/2024 (exact date)..  The indication for her IUD is contraception/cycle control.  IUD Insertion Procedure  Note Patient identified, informed consent performed, consent signed.   Discussed risks of irregular bleeding, cramping, infection, malpositioning, expulsion or uterine perforation of the IUD (1:1000 placements)  which may require further procedure such as laparoscopy.  IUD while effective at preventing pregnancy do not prevent transmission of sexually transmitted diseases and use of barrier methods for this purpose was discussed. Time out was performed.  Urine pregnancy test negative.  Speculum placed in the vagina.  Cervix visualized.  Cleaned with Betadine x 2.  Grasped anteriorly with a single tooth tenaculum.  Uterus sounded to 7 cm. IUD placed per manufacturer's recommendations.  Strings trimmed to 3 cm. Tenaculum was removed, good hemostasis noted.  Patient tolerated procedure well.   Patient was given post-procedure instructions.  She was advised to have backup contraception for one week.  Patient was also asked to check IUD strings periodically and follow up in 6 weeks for IUD check.    Colleen Le, CNM  [facility]   Meds ordered this encounter  Medications   levonorgestrel (MIRENA) 20 MCG/DAY IUD 1 each     Colleen Le [facility], CNM 04/26/2024 5:29 PM

## 2024-04-29 ENCOUNTER — Other Ambulatory Visit: Payer: Self-pay | Admitting: Nurse Practitioner

## 2024-04-29 DIAGNOSIS — M4306 Spondylolysis, lumbar region: Secondary | ICD-10-CM

## 2024-04-30 ENCOUNTER — Other Ambulatory Visit (HOSPITAL_COMMUNITY): Payer: Self-pay

## 2024-04-30 LAB — CYTOLOGY - PAP
Chlamydia: NEGATIVE
Comment: NEGATIVE
Comment: NORMAL
Diagnosis: NEGATIVE
Neisseria Gonorrhea: NEGATIVE

## 2024-05-02 ENCOUNTER — Ambulatory Visit
Admission: RE | Admit: 2024-05-02 | Discharge: 2024-05-02 | Disposition: A | Discharge status: Home / Self Care | Source: Ambulatory Visit | Attending: Nurse Practitioner | Admitting: Nurse Practitioner

## 2024-05-02 ENCOUNTER — Ambulatory Visit: Payer: Self-pay | Admitting: Nurse Practitioner

## 2024-05-02 ENCOUNTER — Ambulatory Visit
Admission: RE | Admit: 2024-05-02 | Discharge: 2024-05-02 | Disposition: A | Discharge status: Home / Self Care | Attending: Nurse Practitioner | Admitting: Nurse Practitioner

## 2024-05-02 DIAGNOSIS — M4306 Spondylolysis, lumbar region: Secondary | ICD-10-CM

## 2024-05-02 DIAGNOSIS — M4319 Spondylolisthesis, multiple sites in spine: Secondary | ICD-10-CM | POA: Diagnosis not present

## 2024-05-02 DIAGNOSIS — M545 Low back pain, unspecified: Secondary | ICD-10-CM | POA: Diagnosis not present

## 2024-05-09 DIAGNOSIS — M4316 Spondylolisthesis, lumbar region: Secondary | ICD-10-CM | POA: Diagnosis not present

## 2024-05-17 DIAGNOSIS — M5451 Vertebrogenic low back pain: Secondary | ICD-10-CM | POA: Diagnosis not present

## 2024-05-20 DIAGNOSIS — M5451 Vertebrogenic low back pain: Secondary | ICD-10-CM | POA: Diagnosis not present

## 2024-05-30 DIAGNOSIS — M5451 Vertebrogenic low back pain: Secondary | ICD-10-CM | POA: Diagnosis not present

## 2024-06-06 DIAGNOSIS — M5451 Vertebrogenic low back pain: Secondary | ICD-10-CM | POA: Diagnosis not present

## 2024-06-11 DIAGNOSIS — M5451 Vertebrogenic low back pain: Secondary | ICD-10-CM | POA: Diagnosis not present

## 2024-06-18 DIAGNOSIS — M5451 Vertebrogenic low back pain: Secondary | ICD-10-CM | POA: Diagnosis not present

## 2024-06-26 DIAGNOSIS — M51369 Other intervertebral disc degeneration, lumbar region without mention of lumbar back pain or lower extremity pain: Secondary | ICD-10-CM | POA: Diagnosis not present

## 2024-06-26 DIAGNOSIS — M48061 Spinal stenosis, lumbar region without neurogenic claudication: Secondary | ICD-10-CM | POA: Diagnosis not present

## 2024-06-26 DIAGNOSIS — M4807 Spinal stenosis, lumbosacral region: Secondary | ICD-10-CM | POA: Diagnosis not present

## 2024-06-26 DIAGNOSIS — M51379 Other intervertebral disc degeneration, lumbosacral region without mention of lumbar back pain or lower extremity pain: Secondary | ICD-10-CM | POA: Diagnosis not present

## 2024-06-26 DIAGNOSIS — M5451 Vertebrogenic low back pain: Secondary | ICD-10-CM | POA: Diagnosis not present

## 2024-06-28 DIAGNOSIS — R52 Pain, unspecified: Secondary | ICD-10-CM | POA: Diagnosis not present

## 2024-06-28 DIAGNOSIS — M5451 Vertebrogenic low back pain: Secondary | ICD-10-CM | POA: Diagnosis not present

## 2024-07-17 DIAGNOSIS — M5416 Radiculopathy, lumbar region: Secondary | ICD-10-CM | POA: Diagnosis not present

## 2024-08-19 ENCOUNTER — Other Ambulatory Visit: Payer: Self-pay

## 2024-08-19 DIAGNOSIS — G43709 Chronic migraine without aura, not intractable, without status migrainosus: Secondary | ICD-10-CM

## 2024-08-19 MED ORDER — SUMATRIPTAN SUCCINATE 25 MG PO TABS: 25.0000 mg | ORAL_TABLET | ORAL | 0 refills | Status: AC | PRN

## 2024-08-19 NOTE — Telephone Encounter (Signed)
 Refill request on SUMAtriptan  (IMITREX ) 25 MG tablet

## 2024-09-20 ENCOUNTER — Encounter: Admitting: Nurse Practitioner

## 2024-10-11 ENCOUNTER — Ambulatory Visit: Admitting: Nurse Practitioner

## 2024-10-11 ENCOUNTER — Encounter: Payer: Self-pay | Admitting: Nurse Practitioner

## 2024-10-11 VITALS — BP 110/80 | HR 78 | Temp 98.0°F | Ht 66.0 in | Wt 195.0 lb

## 2024-10-11 DIAGNOSIS — I10 Essential (primary) hypertension: Secondary | ICD-10-CM | POA: Diagnosis not present

## 2024-10-11 DIAGNOSIS — E66811 Obesity, class 1: Secondary | ICD-10-CM | POA: Diagnosis not present

## 2024-10-11 DIAGNOSIS — Z0000 Encounter for general adult medical examination without abnormal findings: Secondary | ICD-10-CM

## 2024-10-11 DIAGNOSIS — G43709 Chronic migraine without aura, not intractable, without status migrainosus: Secondary | ICD-10-CM | POA: Diagnosis not present

## 2024-10-11 DIAGNOSIS — E785 Hyperlipidemia, unspecified: Secondary | ICD-10-CM | POA: Insufficient documentation

## 2024-10-11 DIAGNOSIS — E6609 Other obesity due to excess calories: Secondary | ICD-10-CM | POA: Diagnosis not present

## 2024-10-11 DIAGNOSIS — Z6833 Body mass index (BMI) 33.0-33.9, adult: Secondary | ICD-10-CM | POA: Diagnosis not present

## 2024-10-11 NOTE — Progress Notes (Signed)
 Name: Colleen Le   MRN: [id number]    DOB: 12/07/2000   Date:10/11/2024       Progress Note  Subjective  Chief Complaint  Chief Complaint  Patient presents with   Annual Exam    HPI  Patient presents for annual CPE. Discussed the use of AI scribe software for clinical note transcription with the patient, who gave verbal consent to proceed.  History of Present Illness Colleen Le is a 23 year old female who presents for an annual physical exam.  Hypertension and diuretic use - Takes hydrochlorothiazide  25 mg daily, usually at 9 AM - Blood pressure remains stable even when medication is occasionally skipped - Experiences nocturia, urinating five times nightly, attributed to water intake and hydrochlorothiazide  use  Hyperlipidemia - Last LDL cholesterol level 100 mg/dL - Triglycerides 837 mg/dL - J8r within normal limits  Obesity and weight management - Current weight 195 pounds, BMI 31.47, waist circumference 40 inches - Lost 14 pounds since last visit (previously 209 pounds) - Weight loss attributed to improved dietary habits including fasting, balanced diet with meat, vegetables, and fruit, avoidance of bread, and occasional pasta - Engages in daily walking for at least 30 minutes, weather permitting  Migraine and tachycardia management - Takes propranolol  40 mg twice daily for migraine suppression and tachycardia - Uses Imitrex  25 mg as needed for migraines  Sleep disturbance - Aims for eight hours of sleep but often disrupted due to back issues  Menstrual irregularity and contraception - Has an IUD, which has significantly reduced menstrual periods - Previous concern of heavy periods resolved - No issues with incontinence or bedwetting  Respiratory symptoms - Uses albuterol  inhaler as needed  Preventive health maintenance - Last Pap smear on April 26, 2024 - Depression screening negative - No history of violence at home - Currently sexually active -  Last dental exam within past six months - Plans to have eye exam before end of year    Diet: well balanced diet Exercise: walking daily 30 min  Sleep: tries to get 8 Last dental exam:this year Last eye exam: needs to schedule  Flowsheet Row Office Visit from 03/15/2024 in [facility]  AUDIT-C Score 0   Depression: Phq 9 is  negative    10/11/2024   10:46 AM 03/15/2024    1:03 PM 01/11/2023    1:51 PM 12/09/2022    3:40 PM 09/13/2022    2:50 PM  Depression screen PHQ 2/9  Decreased Interest 0 0 0 0 0  Down, Depressed, Hopeless 0 0 0 0 0  PHQ - 2 Score 0 0 0 0 0  Altered sleeping   0    Tired, decreased energy   0    Change in appetite   0    Feeling bad or failure about yourself    0    Trouble concentrating   0    Moving slowly or fidgety/restless   0    Suicidal thoughts   0    PHQ-9 Score   0     Difficult doing work/chores   Not difficult at all       Data saved with a previous flowsheet row definition   Hypertension: BP Readings from Last 3 Encounters:  10/11/24 110/80  04/26/24 (!) 146/94  04/25/24 126/78   Obesity: Wt Readings from Last 3 Encounters:  10/11/24 195 lb (88.5 kg)  04/26/24 209 lb 4.8 oz (94.9 kg)  04/25/24 209 lb 4.8 oz (  94.9 kg)   BMI Readings from Last 3 Encounters:  10/11/24 31.47 kg/m  04/26/24 33.78 kg/m  04/25/24 33.78 kg/m    [employer] Visit from 10/11/2024 in [facility]  1 40 inches     Vaccines:  HPV: up to at age 33 , ask insurance if age between 48-45  Shingrix: 29-64 yo and ask insurance if covered when patient above 33 yo Pneumonia:  educated and discussed with patient. Flu:  educated and discussed with patient.  Hep C Screening: completed STD testing and prevention (HIV/chl/gon/syphilis): completed Intimate partner violence: none Sexual History : sexually active  Menstrual History/LMP/Abnormal Bleeding: LMP: last month, IUD Incontinence Symptoms:  none  Breast cancer:  - Last Mammogram: does not qualify - BRCA gene screening: none  Osteoporosis: Discussed high calcium and vitamin D supplementation, weight bearing exercises  Cervical cancer screening: 04/26/2024  Skin cancer: Discussed monitoring for atypical lesions  Colorectal cancer: does not qualify   Lung cancer:   Low Dose CT Chest recommended if Age 69-80 years, 20 pack-year currently smoking OR have quit w/in 15years. Patient does not qualify.   ECG: 03/28/2017  Advanced Care Planning: A voluntary discussion about advance care planning including the explanation and discussion of advance directives.  Discussed health care proxy and Living will, and the patient was able to identify a health care proxy as husband.  Patient does not have a living will at present time. If patient does have living will, I have requested they bring this to the clinic to be scanned in to their chart.  Lipids: Lab Results  Component Value Date   CHOL 168 03/15/2024   CHOL 159 09/09/2022   Lab Results  Component Value Date   HDL 42 (L) 03/15/2024   HDL 44 (L) 09/09/2022   Lab Results  Component Value Date   LDLCALC 100 (H) 03/15/2024   LDLCALC 89 09/09/2022   Lab Results  Component Value Date   TRIG 162 (H) 03/15/2024   TRIG 159 (H) 09/09/2022   Lab Results  Component Value Date   CHOLHDL 4.0 03/15/2024   CHOLHDL 3.6 09/09/2022   No results found for: LDLDIRECT  Glucose: Glucose, Bld  Date Value Ref Range Status  03/15/2024 89 65 - 99 mg/dL Final    Comment:    .            Fasting reference interval .   09/09/2022 98 65 - 99 mg/dL Final    Comment:    .            Fasting reference interval .   12/21/2021 96 70 - 99 mg/dL Final    Comment:    Glucose reference range applies only to samples taken after fasting for at least 8 hours.    Patient Active Problem List   Diagnosis Date Noted   Hyperlipidemia 10/11/2024   Chronic right-sided low back pain with  right-sided sciatica 04/25/2024   Tachycardia 09/09/2022   Chronic migraine without aura without status migrainosus, not intractable 09/09/2022   Pars defect of lumbar spine 06/09/2022   Class 1 obesity due to excess calories without serious comorbidity with body mass index (BMI) of 33.0 to 33.9 in adult 03/30/2022   Essential hypertension 01/03/2022    Past Surgical History:  Procedure Laterality Date   FRACTURE SURGERY     TONSILLECTOMY      Family History  Problem Relation Age of Onset   Diabetes Mother    Hypertension Mother  Hypertension Father    Diabetes Father     Social History   Socioeconomic History   Marital status: Single    Spouse name: Not on file   Number of children: Not on file   Years of education: Not on file   Highest education level: Not on file  Occupational History   Not on file  Tobacco Use   Smoking status: Never   Smokeless tobacco: Never  Vaping Use   Vaping status: Never Used  Substance and Sexual Activity   Alcohol use: Yes   Drug use: Never   Sexual activity: Yes  Other Topics Concern   Not on file  Social History Narrative   Not on file   Social Drivers of Health   Tobacco Use: Low Risk (10/11/2024)   Patient History    Smoking Tobacco Use: Never    Smokeless Tobacco Use: Never    Passive Exposure: Not on file  Financial Resource Strain: Low Risk (10/11/2024)   Overall Financial Resource Strain (CARDIA)    Difficulty of Paying Living Expenses: Not hard at all  Food Insecurity: No Food Insecurity (10/11/2024)   Epic    Worried About Radiation Protection Practitioner of Food in the Last Year: Never true    Ran Out of Food in the Last Year: Never true  Transportation Needs: No Transportation Needs (10/11/2024)   Epic    Lack of Transportation (Medical): No    Lack of Transportation (Non-Medical): No  Physical Activity: Sufficiently Active (10/11/2024)   Exercise Vital Sign    Days of Exercise per Week: 7 days    Minutes of Exercise per  Session: 30 min  Stress: No Stress Concern Present (10/11/2024)   [employer] of Occupational Health - Occupational Stress Questionnaire    Feeling of Stress: Not at all  Social Connections: Moderately Integrated (10/11/2024)   Social Connection and Isolation Panel    Frequency of Communication with Friends and Family: More than three times a week    Frequency of Social Gatherings with Friends and Family: More than three times a week    Attends Religious Services: 1 to 4 times per year    Active Member of Clubs or Organizations: No    Attends Banker Meetings: Never    Marital Status: Married  Catering Manager Violence: Not At Risk (10/11/2024)   Epic    Fear of Current or Ex-Partner: No    Emotionally Abused: No    Physically Abused: No    Sexually Abused: No  Depression (PHQ2-9): Low Risk (10/11/2024)   Depression (PHQ2-9)    PHQ-2 Score: 0  Alcohol Screen: Low Risk (03/15/2024)   Alcohol Screen    Last Alcohol Screening Score (AUDIT): 0  Housing: Unknown (10/11/2024)   Epic    Unable to Pay for Housing in the Last Year: No    Number of Times Moved in the Last Year: Not on file    Homeless in the Last Year: No  Utilities: Not At Risk (10/11/2024)   Epic    Threatened with loss of utilities: No  Health Literacy: Adequate Health Literacy (10/11/2024)   B1300 Health Literacy    Frequency of need for help with medical instructions: Never    Current Medications[1]  Allergies[2]   ROS  Constitutional: Negative for fever or weight change.  Respiratory: Negative for cough and shortness of breath.   Cardiovascular: Negative for chest pain or palpitations.  Gastrointestinal: Negative for abdominal pain, no bowel changes.  Musculoskeletal: Negative for  gait problem or joint swelling.  Skin: Negative for rash.  Neurological: Negative for dizziness or headache.  No other specific complaints in a complete review of systems (except as listed in HPI above).    Objective  Vitals:   10/11/24 1030  BP: 110/80  Pulse: 78  Temp: 98 F (36.7 C)  SpO2: 99%  Weight: 195 lb (88.5 kg)  Height: 5' 6 (1.676 m)    Body mass index is 31.47 kg/m.  Physical Exam Vitals reviewed.  Constitutional:      Appearance: Normal appearance.  HENT:     Head: Normocephalic.     Right Ear: Tympanic membrane normal.     Left Ear: Tympanic membrane normal.     Nose: Nose normal.  Eyes:     Extraocular Movements: Extraocular movements intact.     Conjunctiva/sclera: Conjunctivae normal.     Pupils: Pupils are equal, round, and reactive to light.  Neck:     Thyroid: No thyroid mass, thyromegaly or thyroid tenderness.  Cardiovascular:     Rate and Rhythm: Normal rate and regular rhythm.     Pulses: Normal pulses.     Heart sounds: Normal heart sounds.  Pulmonary:     Effort: Pulmonary effort is normal.     Breath sounds: Normal breath sounds.  Abdominal:     General: Bowel sounds are normal.     Palpations: Abdomen is soft.  Musculoskeletal:        General: Normal range of motion.     Cervical back: Normal range of motion and neck supple.     Right lower leg: No edema.     Left lower leg: No edema.  Skin:    General: Skin is warm and dry.     Capillary Refill: Capillary refill takes less than 2 seconds.  Neurological:     General: No focal deficit present.     Mental Status: She is alert and oriented to person, place, and time. Mental status is at baseline.  Psychiatric:        Mood and Affect: Mood normal.        Behavior: Behavior normal.        Thought Content: Thought content normal.        Judgment: Judgment normal.      Fall Risk:    03/15/2024    1:03 PM 01/11/2023    1:51 PM 12/09/2022    3:39 PM 09/13/2022    2:50 PM 09/09/2022    1:37 PM  Fall Risk   Falls in the past year? 0 0 0 0 0  Number falls in past yr: 0 0 0 0 0  Injury with Fall? 0  0  0  0  0   Risk for fall due to : No Fall Risks No Fall Risks     Follow up  Falls evaluation completed Falls prevention discussed;Education provided;Falls evaluation completed  Falls evaluation completed  Falls evaluation completed      Data saved with a previous flowsheet row definition     Functional Status Survey: Is the patient deaf or have difficulty hearing?: No Does the patient have difficulty seeing, even when wearing glasses/contacts?: No Does the patient have difficulty concentrating, remembering, or making decisions?: No Does the patient have difficulty walking or climbing stairs?: No Does the patient have difficulty dressing or bathing?: No Does the patient have difficulty doing errands alone such as visiting a doctor's office or shopping?: No   Assessment & Plan  Problem  List Items Addressed This Visit       Cardiovascular and Mediastinum   Essential hypertension   Chronic migraine without aura without status migrainosus, not intractable     Other   Class 1 obesity due to excess calories without serious comorbidity with body mass index (BMI) of 33.0 to 33.9 in adult   Hyperlipidemia   Other Visit Diagnoses       Annual physical exam    -  Primary      Assessment and Plan Assessment & Plan Essential hypertension Blood pressure is well-controlled on current regimen. She reports nocturia, likely related to hydrochlorothiazide  use. She prefers to discontinue hydrochlorothiazide  and manage hypertension with propranolol  alone. - Discontinued hydrochlorothiazide  - Monitor blood pressure and report if it increases  Hyperlipidemia Cholesterol levels slightly elevated but not requiring medication. Last LDL was 100 mg/dL and triglycerides were 162 mg/dL. She is managing diet and weight, which may help control lipid levels. - Continue current dietary management and weight loss efforts  Chronic migraine without aura Chronic migraines managed with Qulipta  and propranolol . She uses Imitrex  as needed for acute episodes. - Continue Qulipta  30 mg  daily - Continue propranolol  for migraine suppression - Use Imitrex  25 mg as needed for acute migraine episodes  Obesity, class 1 BMI is 31.47, indicating class 1 obesity. She has lost 14 pounds since last visit, now weighing 195 pounds. She is following a balanced diet and engaging in regular physical activity. - Encouraged further weight loss with a goal of 30 pounds  General Health Maintenance Pap smear completed earlier this year. Depression screening is negative. No need for mammograms or colorectal cancer screening at this time. Last dental exam was within the last six months. Eye exam is due before the end of the year. - Schedule eye exam before the end of the year     -USPSTF grade A and B recommendations reviewed with patient; age-appropriate recommendations, preventive care, screening tests, etc discussed and encouraged; healthy living encouraged; see AVS for patient education given to patient -Discussed importance of 150 minutes of physical activity weekly, eat two servings of fish weekly, eat one serving of tree nuts ( cashews, pistachios, pecans, almonds.SABRA) every other day, eat 6 servings of fruit/vegetables daily and drink plenty of water and avoid sweet beverages.   -Reviewed Health Maintenance: yes     [1]  Current Outpatient Medications:    albuterol  (VENTOLIN  HFA) 108 (90 Base) MCG/ACT inhaler, Inhale 2 puffs into the lungs every 6 (six) hours as needed for wheezing or shortness of breath., Disp: 8 g, Rfl: 0   Atogepant  (QULIPTA ) 30 MG TABS, Take 1 tablet (30 mg total) by mouth daily., Disp: 90 tablet, Rfl: 1   cetirizine (ZYRTEC) 10 MG tablet, Take 10 mg by mouth daily., Disp: , Rfl:    ondansetron  (ZOFRAN -ODT) 4 MG disintegrating tablet, Take 1 tablet (4 mg total) by mouth every 8 (eight) hours as needed for nausea or vomiting., Disp: 20 tablet, Rfl: 0   propranolol  (INDERAL ) 40 MG tablet, Take 1 tablet (40 mg total) by mouth 2 (two) times daily., Disp: 180 tablet, Rfl:  1   SUMAtriptan  (IMITREX ) 25 MG tablet, Take 1 tablet (25 mg total) by mouth every 2 (two) hours as needed for migraine. May repeat in 2 hours if headache persists or recurs., Disp: 10 tablet, Rfl: 0   gabapentin  (NEURONTIN ) 100 MG capsule, Take 1 capsule (100 mg total) by mouth 3 (three) times daily., Disp: 90 capsule, Rfl: 0 [2]  No Known Allergies

## 2024-10-21 ENCOUNTER — Other Ambulatory Visit (HOSPITAL_COMMUNITY): Payer: Self-pay

## 2024-10-21 ENCOUNTER — Telehealth: Payer: Self-pay | Admitting: Pharmacy Technician

## 2024-10-21 NOTE — Telephone Encounter (Signed)
 Pharmacy Patient Advocate Encounter  Received notification from [employer] that Prior Authorization for Qulipta  30MG  tablets has been APPROVED from 10/21/24 to 10/21/25   PA #/Case ID/Reference #: [id number]

## 2024-10-21 NOTE — Telephone Encounter (Signed)
 Pharmacy Patient Advocate Encounter   Received notification from CoverMyMeds that prior authorization for Qulipta  30MG  tablets is due for renewal.   Insurance verification completed.   The patient is insured through [facility].  Action: PA required; PA started via CoverMyMeds. KEY [id number] . Waiting for clinical questions to populate.

## 2024-10-23 ENCOUNTER — Telehealth: Payer: Self-pay

## 2024-10-23 NOTE — Telephone Encounter (Signed)
 Copied from CRM #[id number]. Topic: General - Other >> Oct 23, 2024  8:30 AM Willma R wrote: Reason for CRM: Patient states she spoke to an on call nurse last night and was prescribed Tamaflu. On call advised her to reach out to her PCP to let them know.   Patient can be reached at [phone]

## 2024-10-28 ENCOUNTER — Emergency Department
Admission: EM | Admit: 2024-10-28 | Discharge: 2024-10-28 | Disposition: A | Discharge status: Home / Self Care | Attending: Emergency Medicine | Admitting: Emergency Medicine

## 2024-10-28 ENCOUNTER — Emergency Department

## 2024-10-28 DIAGNOSIS — I10 Essential (primary) hypertension: Secondary | ICD-10-CM | POA: Insufficient documentation

## 2024-10-28 DIAGNOSIS — R079 Chest pain, unspecified: Secondary | ICD-10-CM

## 2024-10-28 DIAGNOSIS — R0789 Other chest pain: Secondary | ICD-10-CM | POA: Diagnosis present

## 2024-10-28 DIAGNOSIS — M546 Pain in thoracic spine: Secondary | ICD-10-CM | POA: Insufficient documentation

## 2024-10-28 LAB — CBC
HCT: 47.4 % — ABNORMAL HIGH (ref 36.0–46.0)
Hemoglobin: 15.7 g/dL — ABNORMAL HIGH (ref 12.0–15.0)
MCH: 29.6 pg (ref 26.0–34.0)
MCHC: 33.1 g/dL (ref 30.0–36.0)
MCV: 89.4 fL (ref 80.0–100.0)
Platelets: 277 K/uL (ref 150–400)
RBC: 5.3 MIL/uL — ABNORMAL HIGH (ref 3.87–5.11)
RDW: 12.5 % (ref 11.5–15.5)
WBC: 9.7 K/uL (ref 4.0–10.5)
nRBC: 0 % (ref 0.0–0.2)

## 2024-10-28 LAB — BASIC METABOLIC PANEL WITH GFR
Anion gap: 12 (ref 5–15)
BUN: 11 mg/dL (ref 6–20)
CO2: 25 mmol/L (ref 22–32)
Calcium: 10.6 mg/dL — ABNORMAL HIGH (ref 8.9–10.3)
Chloride: 102 mmol/L (ref 98–111)
Creatinine, Ser: 0.87 mg/dL (ref 0.44–1.00)
GFR, Estimated: >60 mL/min
Glucose, Bld: 97 mg/dL (ref 70–99)
Potassium: 3.6 mmol/L (ref 3.5–5.1)
Sodium: 139 mmol/L (ref 135–145)

## 2024-10-28 LAB — HEPATIC FUNCTION PANEL
ALT: 46 U/L — ABNORMAL HIGH (ref 0–44)
AST: 25 U/L (ref 15–41)
Albumin: 4.4 g/dL (ref 3.5–5.0)
Alkaline Phosphatase: 84 U/L (ref 38–126)
Bilirubin, Direct: 0.1 mg/dL (ref 0.0–0.2)
Indirect Bilirubin: 0.1 mg/dL — ABNORMAL LOW (ref 0.3–0.9)
Total Bilirubin: 0.2 mg/dL (ref 0.0–1.2)
Total Protein: 7.1 g/dL (ref 6.5–8.1)

## 2024-10-28 LAB — TROPONIN T, HIGH SENSITIVITY
Troponin T High Sensitivity: <15 ng/L (ref 0–19)
Troponin T High Sensitivity: <15 ng/L (ref 0–19)

## 2024-10-28 LAB — LIPASE, BLOOD: Lipase: 37 U/L (ref 11–51)

## 2024-10-28 NOTE — Discharge Instructions (Signed)
 Your cardiac workup is overall reassuring at this time with no signs of any serious lab abnormalities.  Your troponin is normal x 2.  Your EKG does not show any evidence of any arrhythmias.  No x-ray evidence of any acute infectious process.  You should continue to monitor your symptoms follow-up with primary provider for ongoing evaluation.  Return to the ED if necessary.

## 2024-10-28 NOTE — ED Provider Notes (Signed)
 "   [facility] Emergency Department Provider Note     Event Date/Time   First MD Initiated Contact with Patient 10/28/24 2034     (approximate)   History   Chest Pain   HPI  Colleen Le is a 23 y.o. female with a history of HTN, obesity, and HLD, who presents to the ED for evaluation of central chest pain with referral to the upper back.  Patient was recent diagnosed with the flu, has been taking OTC TheraFlu for symptom relief.  She also reports noncompliance with the BP medicine, noted that she stopped her medicine about a month prior.  She presents to the ED today, for evaluation of her chest and mid back pain, as well as some concern for elevated blood pressure readings.  Patient was out at the shopping center with her mother, and then just had some lunch, prior to the onset of her symptoms.  She reports a central chest pain and pressure, with some referral to the mid back.  She endorses shortness of breath, but denies any diaphoresis.  No fevers, chills, sweats reported.,  No nausea, vomiting, bowel changes noted.  At the time of my evaluation, patient is endorsing near resolution of her symptoms.     Physical Exam   Triage Vital Signs: ED Triage Vitals  Encounter Vitals Group     BP 10/28/24 1737 (!) 140/99     Girls Systolic BP Percentile --      Girls Diastolic BP Percentile --      Boys Systolic BP Percentile --      Boys Diastolic BP Percentile --      Pulse Rate 10/28/24 1737 91     Resp 10/28/24 1737 18     Temp 10/28/24 1737 97.8 F (36.6 C)     Temp Source 10/28/24 1737 Oral     SpO2 10/28/24 1737 97 %     Weight 10/28/24 1734 193 lb (87.5 kg)     Height 10/28/24 1734 5' 6 (1.676 m)     Head Circumference --      Peak Flow --      Pain Score 10/28/24 1733 8     Pain Loc --      Pain Education --      Exclude from Growth Chart --     Most recent vital signs: Vitals:   10/28/24 1737 10/28/24 2111  BP: (!) 140/99 (!) 139/94   Pulse: 91 78  Resp: 18 16  Temp: 97.8 F (36.6 C) 97.8 F (36.6 C)  SpO2: 97% 98%    General Awake, no distress.  NAD HEENT NCAT. PERRL. EOMI. No rhinorrhea. Mucous membranes are moist. CV:  Good peripheral perfusion.  Regular rate and rhythm RESP:  Normal effort.  CTA ABD:  No distention.  Soft and nontender MSK:  AROM of all extremities   ED Results / Procedures / Treatments   Labs (all labs ordered are listed, but only abnormal results are displayed) Labs Reviewed  BASIC METABOLIC PANEL WITH GFR - Abnormal; Notable for the following components:      Result Value   Calcium 10.6 (*)    All other components within normal limits  CBC - Abnormal; Notable for the following components:   RBC 5.30 (*)    Hemoglobin 15.7 (*)    HCT 47.4 (*)    All other components within normal limits  HEPATIC FUNCTION PANEL - Abnormal; Notable for the following components:   ALT  46 (*)    Indirect Bilirubin 0.1 (*)    All other components within normal limits  LIPASE, BLOOD  POC URINE PREG, ED  TROPONIN T, HIGH SENSITIVITY  TROPONIN T, HIGH SENSITIVITY    EKG  Vent. rate 91 BPM  PR interval 148 ms  QRS duration 98 ms  QT/QTcB 356/437 ms  P-R-T axes 47 59 23  Normal sinus rhythm  ST & T wave abnormality, consider inferior ischemia  Abnormal ECG When compared with ECG of 28-Mar-2017  No STEMI  RADIOLOGY  I personally viewed and evaluated these images as part of my medical decision making, as well as reviewing the written report by the radiologist.  ED Provider Interpretation: No acute findings  DG Chest 2 View Result Date: 10/28/2024 CLINICAL DATA:  Chest pain. EXAM: CHEST - 2 VIEW COMPARISON:  03/28/2017 FINDINGS: The cardiomediastinal contours are normal. The lungs are clear. Pulmonary vasculature is normal. No consolidation, pleural effusion, or pneumothorax. No acute osseous abnormalities are seen. IMPRESSION: No active cardiopulmonary disease. Electronically Signed   By:  [name] M.D.   On: 10/28/2024 19:16     PROCEDURES:  Critical Care performed: No  Procedures   MEDICATIONS ORDERED IN ED: Medications - No data to display   IMPRESSION / MDM / ASSESSMENT AND PLAN / ED COURSE  I reviewed the triage vital signs and the nursing notes.                              Differential diagnosis includes, but is not limited to, ACS, aortic dissection, pulmonary embolism, cardiac tamponade, pneumothorax, pneumonia, pericarditis, myocarditis, GI-related causes including esophagitis/gastritis, and musculoskeletal chest wall pain.     Patient's presentation is most consistent with acute complicated illness / injury requiring diagnostic workup.  Patient's diagnosis is consistent with nonspecific chest pain.  Patient to the ED for evaluation of sudden onset of central chest pain with referral to the back.  Patient presents in no acute distress via POV from home.  Symptoms have all but resolved at time of my evaluation.  Your cardiac workup is overall reassuring.  No acute lab abnormalities are noted.  Troponin is flat x 2.  No malignant arrhythmia noted on EKG.  Chest x-ray interpreted by me, shows no acute intrathoracic process.  [place] labs are unremarkable.  Patient stable at this time with no ongoing chest pain.  Patient will be discharged home with instructions to take OTC Tylenol or Motrin as needed. Patient is to follow up with her primary provider as suggested, as needed or otherwise directed. Patient is given ED precautions to return to the ED for any worsening or new symptoms.     FINAL CLINICAL IMPRESSION(S) / ED DIAGNOSES   Final diagnoses:  Nonspecific chest pain     Rx / DC Orders   ED Discharge Orders     None        Note:  This document was prepared using Dragon voice recognition software and may include unintentional dictation errors.    Loyd [name], PA-C 10/28/24 2333  "

## 2024-10-28 NOTE — ED Triage Notes (Signed)
 Pt presents to the ED via POV from home with central CP that she reports radiates into her upper back. Pt reports recently having the flu and taking Theraflu. Pt reports hx of htn, but states that she hadn't been taking her BP medication because her BP had been good. Reports stopping this medication less than a month ago. Pt now complains of htn.

## 2024-10-28 NOTE — ED Notes (Signed)
 Lab was called to add on Lipase and Hepatic labs.

## 2024-11-21 ENCOUNTER — Encounter: Admitting: Nurse Practitioner

## 2024-12-04 ENCOUNTER — Telehealth: Admitting: Nurse Practitioner

## 2024-12-04 DIAGNOSIS — J069 Acute upper respiratory infection, unspecified: Secondary | ICD-10-CM

## 2024-12-04 MED ORDER — PROMETHAZINE-DM 6.25-15 MG/5ML PO SYRP: 5.0000 mL | ORAL_SOLUTION | Freq: Four times a day (QID) | ORAL | 0 refills | Status: AC | PRN

## 2024-12-04 NOTE — Patient Instructions (Addendum)
 Recommend taking zyrtec, flonase, extra strength mucinex, vitamin d, vitamin c, and zinc. Push fluids and get rest.

## 2024-12-04 NOTE — Progress Notes (Signed)
 "  Name: Colleen Le   MRN: [id number]    DOB: Oct 01, 2001   Date:12/04/2024       Progress Note  Subjective  Chief Complaint  No chief complaint on file.   I connected with  Colleen Le  on 12/04/24 at  9:40 AM EST by a video enabled telemedicine application and verified that I am speaking with the correct person using two identifiers.  I discussed the limitations of evaluation and management by telemedicine and the availability of in person appointments. The patient expressed understanding and agreed to proceed with a virtual visit  Staff also discussed with the patient that there may be a patient responsible charge related to this service. Patient Location: home Provider Location: [facility] Additional Individuals present: alone  HPI   Discussed the use of AI scribe software for clinical note transcription with the patient, who gave verbal consent to proceed.  History of Present Illness A 24 year old female who presents with worsening sinus drainage, sneezing, and coughing.  Upper respiratory symptoms - Onset of symptoms Friday morning - Progressive worsening over several days - Sinus drainage increased in quantity - Mucus described as green and occasionally bloody during nasal expulsion - Frequent sneezing  Cough - Mild but persistent cough - Cough has worsened over the past few days  Sleep disturbance - Significant difficulty sleeping due to choking on mucus - Resulting in exhaustion  Medication use - No medications taken prior to this visit due to uncertainty about appropriate treatment    Patient Active Problem List   Diagnosis Date Noted   Hyperlipidemia 10/11/2024   Chronic right-sided low back pain with right-sided sciatica 04/25/2024   Tachycardia 09/09/2022   Chronic migraine without aura without status migrainosus, not intractable 09/09/2022   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  ROS  Ten systems reviewed and is negative except as mentioned in HPI   Objective  Virtual encounter, vitals not obtained.  There is no height or weight on file to calculate BMI.  Nursing Note and Vital Signs reviewed.  Physical Exam  Awake, alert and oriented, speaking in complete sentences   No results found for this or any previous visit (from the past 72 hours).  Assessment & Plan  Assessment and Plan Assessment & Plan Acute viral upper respiratory infection Symptoms began on Friday with worsening sinus drainage, sneezing, coughing, and green, bloody mucus. Outside the treatment window for flu or COVID, so testing is not indicated. Differential includes RSV, COVID, and adenovirus, but treatment remains the same. Symptoms include difficulty sleeping due to choking on mucus and fatigue from lack of sleep. Over-the-counter cough medicine is ineffective as cough is productive. - Take Mucinex (guaifenesin) 1200 mg twice daily. - Take an allergy pill (Zyrtec, Claritin, Allegra) daily. - Use Flonase nasal spray to open sinuses. - Avoid dairy to prevent thickening of mucus. - Increase water intake to help thin mucus and prevent dehydration. - Take vitamin D, vitamin C, and zinc to boost immune system. - Prescribed cough syrup for bedtime  to suppress cough and aid sleep. - If not significantly better by Monday, will consider prescribing an antibiotic for potential sinus infection. - If symptoms worsen, such as increased congestion, cough, or shortness of breath, will consider in-person evaluation for possible  pneumonia.      -Red flags and when to present for emergency care or RTC including fever >101.54F, chest pain, shortness of breath, new/worsening/un-resolving symptoms,  reviewed with patient at time of visit. Follow up and care instructions discussed and provided in AVS. - I discussed the assessment and treatment plan with the patient. The patient was provided an opportunity to ask questions and all were answered. The patient agreed with the plan and demonstrated an understanding of the instructions.  I provided 15 minutes of non-face-to-face time during this encounter.  [name], FNP      [1]  Current Outpatient Medications:    promethazine -dextromethorphan (PROMETHAZINE -DM) 6.25-15 MG/5ML syrup, Take 5 mLs by mouth 4 (four) times daily as needed for cough., Disp: 118 mL, Rfl: 0   albuterol  (VENTOLIN  HFA) 108 (90 Base) MCG/ACT inhaler, Inhale 2 puffs into the lungs every 6 (six) hours as needed for wheezing or shortness of breath., Disp: 8 g, Rfl: 0   Atogepant  (QULIPTA ) 30 MG TABS, Take 1 tablet (30 mg total) by mouth daily., Disp: 90 tablet, Rfl: 1   cetirizine (ZYRTEC) 10 MG tablet, Take 10 mg by mouth daily., Disp: , Rfl:    gabapentin  (NEURONTIN ) 100 MG capsule, Take 1 capsule (100 mg total) by mouth 3 (three) times daily., Disp: 90 capsule, Rfl: 0   ondansetron  (ZOFRAN -ODT) 4 MG disintegrating tablet, Take 1 tablet (4 mg total) by mouth every 8 (eight) hours as needed for nausea or vomiting., Disp: 20 tablet, Rfl: 0   propranolol  (INDERAL ) 40 MG tablet, Take 1 tablet (40 mg total) by mouth 2 (two) times daily., Disp: 180 tablet, Rfl: 1   SUMAtriptan  (IMITREX ) 25 MG tablet, Take 1 tablet (25 mg total) by mouth every 2 (two) hours as needed for migraine. May repeat in 2 hours if headache persists or recurs., Disp: 10 tablet, Rfl: 0 [2] No Known Allergies  "
